# Patient Record
Sex: Female | Born: 1995 | Race: Black or African American | Hispanic: No | Marital: Single | Smoking: Current some day smoker
Health system: Southern US, Community
[De-identification: ages and names within clinical notes are randomized; demographics above are authoritative.]

## PROBLEM LIST (undated history)

## (undated) DIAGNOSIS — N83209 Unspecified ovarian cyst, unspecified side: Secondary | ICD-10-CM

## (undated) DIAGNOSIS — N809 Endometriosis, unspecified: Secondary | ICD-10-CM

## (undated) HISTORY — PX: THERAPEUTIC ABORTION: SHX798

---

## 2019-09-29 ENCOUNTER — Ambulatory Visit: Payer: Self-pay | Attending: Family

## 2019-09-29 DIAGNOSIS — Z23 Encounter for immunization: Secondary | ICD-10-CM

## 2019-09-29 NOTE — Progress Notes (Signed)
   Covid-19 Vaccination Clinic  Name:  Anisa Leanos    MRN: [id number] DOB: 06-30-1996  09/29/2019  Ms. Candella was observed post Covid-19 immunization for 15 minutes without incident. She was provided with Vaccine Information Sheet and instruction to access the V-Safe system.   Ms. Walrond was instructed to call 911 with any severe reactions post vaccine: Marland Kitchen Difficulty breathing  . Swelling of face and throat  . A fast heartbeat  . A bad rash all over body  . Dizziness and weakness   Immunizations Administered    Name Date Dose VIS Date Route   Moderna COVID-19 Vaccine 09/29/2019  1:12 PM 0.5 mL 06/07/2019 Intramuscular   Manufacturer: Moderna   Lot: [id number]   NDC: 31121-624-46

## 2019-11-01 ENCOUNTER — Ambulatory Visit: Payer: Self-pay | Attending: Family

## 2019-11-08 ENCOUNTER — Ambulatory Visit: Payer: Self-pay

## 2019-11-10 ENCOUNTER — Ambulatory Visit: Payer: BLUE CROSS/BLUE SHIELD | Attending: Family

## 2019-11-10 DIAGNOSIS — Z23 Encounter for immunization: Secondary | ICD-10-CM

## 2019-11-10 NOTE — Progress Notes (Signed)
   Covid-19 Vaccination Clinic  Name:  Caroline Lynch    MRN: [id number] DOB: 1995-12-21  11/10/2019  [name] was observed post Covid-19 immunization for 15 minutes without incident. She was provided with Vaccine Information Sheet and instruction to access the V-Safe system.   [name] was instructed to call 911 with any severe reactions post vaccine: Marland Kitchen Difficulty breathing  . Swelling of face and throat  . A fast heartbeat  . A bad rash all over body  . Dizziness and weakness   Immunizations Administered    Name Date Dose VIS Date Route   Moderna COVID-19 Vaccine 11/10/2019  1:19 PM 0.5 mL 06/2019 Intramuscular   Manufacturer: Moderna   Lot: [id number]   NDC: 17616-073-71

## 2019-11-13 ENCOUNTER — Other Ambulatory Visit: Payer: Self-pay

## 2019-11-13 ENCOUNTER — Emergency Department (HOSPITAL_COMMUNITY): Payer: BC Managed Care – PPO

## 2019-11-13 ENCOUNTER — Encounter (HOSPITAL_COMMUNITY): Payer: Self-pay | Admitting: Emergency Medicine

## 2019-11-13 ENCOUNTER — Emergency Department (HOSPITAL_COMMUNITY)
Admission: EM | Admit: 2019-11-13 | Discharge: 2019-11-13 | Disposition: A | Discharge status: Home / Self Care | Payer: BC Managed Care – PPO | Attending: Emergency Medicine | Admitting: Emergency Medicine

## 2019-11-13 DIAGNOSIS — F419 Anxiety disorder, unspecified: Secondary | ICD-10-CM | POA: Diagnosis not present

## 2019-11-13 DIAGNOSIS — R079 Chest pain, unspecified: Secondary | ICD-10-CM | POA: Diagnosis not present

## 2019-11-13 DIAGNOSIS — R000 Tachycardia, unspecified: Secondary | ICD-10-CM | POA: Insufficient documentation

## 2019-11-13 DIAGNOSIS — M79602 Pain in left arm: Secondary | ICD-10-CM | POA: Diagnosis present

## 2019-11-13 DIAGNOSIS — R002 Palpitations: Secondary | ICD-10-CM | POA: Diagnosis not present

## 2019-11-13 LAB — RAPID URINE DRUG SCREEN, HOSP PERFORMED
Amphetamines: NOT DETECTED
Barbiturates: NOT DETECTED
Benzodiazepines: NOT DETECTED
Cocaine: NOT DETECTED
Opiates: NOT DETECTED
Tetrahydrocannabinol: POSITIVE — AB

## 2019-11-13 LAB — CBC WITH DIFFERENTIAL/PLATELET
Abs Immature Granulocytes: 0.01 10*3/uL (ref 0.00–0.07)
Basophils Absolute: 0 10*3/uL (ref 0.0–0.1)
Basophils Relative: 0 %
Eosinophils Absolute: 0.1 10*3/uL (ref 0.0–0.5)
Eosinophils Relative: 2 %
HCT: 37.2 % (ref 36.0–46.0)
Hemoglobin: 11.9 g/dL — ABNORMAL LOW (ref 12.0–15.0)
Immature Granulocytes: 0 %
Lymphocytes Relative: 59 %
Lymphs Abs: 3.8 10*3/uL (ref 0.7–4.0)
MCH: 30.6 pg (ref 26.0–34.0)
MCHC: 32 g/dL (ref 30.0–36.0)
MCV: 95.6 fL (ref 80.0–100.0)
Monocytes Absolute: 0.6 10*3/uL (ref 0.1–1.0)
Monocytes Relative: 10 %
Neutro Abs: 1.9 10*3/uL (ref 1.7–7.7)
Neutrophils Relative %: 29 %
Platelets: 277 10*3/uL (ref 150–400)
RBC: 3.89 MIL/uL (ref 3.87–5.11)
RDW: 12.3 % (ref 11.5–15.5)
WBC: 6.5 10*3/uL (ref 4.0–10.5)
nRBC: 0 % (ref 0.0–0.2)

## 2019-11-13 LAB — BASIC METABOLIC PANEL
Anion gap: 13 (ref 5–15)
BUN: 15 mg/dL (ref 6–20)
CO2: 23 mmol/L (ref 22–32)
Calcium: 9.5 mg/dL (ref 8.9–10.3)
Chloride: 101 mmol/L (ref 98–111)
Creatinine, Ser: 0.91 mg/dL (ref 0.44–1.00)
GFR calc Af Amer: >60 mL/min (ref >60)
GFR calc non Af Amer: >60 mL/min (ref >60)
Glucose, Bld: 151 mg/dL — ABNORMAL HIGH (ref 70–99)
Potassium: 3.1 mmol/L — ABNORMAL LOW (ref 3.5–5.1)
Sodium: 137 mmol/L (ref 135–145)

## 2019-11-13 LAB — TROPONIN I (HIGH SENSITIVITY)
Troponin I (High Sensitivity): <2 ng/L (ref <18)
Troponin I (High Sensitivity): <2 ng/L (ref <18)

## 2019-11-13 LAB — D-DIMER, QUANTITATIVE: D-Dimer, Quant: <0.27 ug/mL-FEU (ref 0.00–0.50)

## 2019-11-13 LAB — ETHANOL: Alcohol, Ethyl (B): 24 mg/dL — ABNORMAL HIGH (ref <10)

## 2019-11-13 LAB — HCG, QUANTITATIVE, PREGNANCY: hCG, Beta Chain, Quant, S: 1 m[IU]/mL (ref <5)

## 2019-11-13 MED ORDER — LORAZEPAM 2 MG/ML IJ SOLN
0.5000 mg | Freq: Once | INTRAMUSCULAR | Status: AC
  Administered 2019-11-13: 06:00:00 0.5 mg via INTRAVENOUS
  Filled 2019-11-13: qty 1

## 2019-11-13 MED ORDER — SODIUM CHLORIDE 0.9 % IV BOLUS
1000.0000 mL | Freq: Once | INTRAVENOUS | Status: AC
  Administered 2019-11-13: 1000 mL via INTRAVENOUS

## 2019-11-13 NOTE — Discharge Instructions (Addendum)
Please return to the emergency department with any new or worsening symptoms.

## 2019-11-13 NOTE — ED Provider Notes (Signed)
[facility] DEPT Provider Note   CSN: [id number] Arrival date & time: 11/13/19  0350     History Chief Complaint  Patient presents with  . Arm Pain  . Tachycardia    Caroline Lynch is a 24 y.o. female.  Patient with a history of anxiety presents with intermittent chest discomfort through today described as involving the left arm, left axilla and left chest without SOB or difficulty breathing. The discomfort can last for varying amounts of time and does not have any identifiable modifying factors. She finished her day at work and went home where she had dinner, some alcohol and marijuana and the symptoms returned shortly afterward, associated with the feeling of her heart racing. The discomfort has improved but she reports continued palpitations. She has a history of anxiety and thought it might be due to that but palpitations have been persistent for over 2 hours. No syncope/near syncope. She reports having received her 2nd COVID vaccine 3 days ago. No nausea, vomiting, cough, congestion, sore throat.   The history is provided by the patient. No language interpreter was used.  Arm Pain Associated symptoms include chest pain. Pertinent negatives include no abdominal pain, no headaches and no shortness of breath.       History reviewed. No pertinent past medical history.  There are no problems to display for this patient.   History reviewed. No pertinent surgical history.   OB History   No obstetric history on file.     No family history on file.  Social History   Tobacco Use  . Smoking status: Never Smoker  . Smokeless tobacco: Never Used  Substance Use Topics  . Alcohol use: Yes    Comment: occasional  . Drug use: Yes    Types: Marijuana    Comment: occasional    Home Medications Prior to Admission medications   Not on File    Allergies    Patient has no known allergies.  Review of Systems   Review of Systems  Constitutional:  Negative for chills and fever.  HENT: Negative.   Respiratory: Negative.  Negative for shortness of breath.   Cardiovascular: Positive for chest pain and palpitations.  Gastrointestinal: Negative.  Negative for abdominal pain and nausea.  Musculoskeletal: Negative.  Negative for myalgias.  Skin: Negative.   Neurological: Negative.  Negative for syncope, weakness and headaches.    Physical Exam Updated Vital Signs BP (!) 159/96 (BP Location: Right Arm)   Pulse (!) 146   Temp 98 F (36.7 C) (Oral)   Resp 18   Ht 5\' 7"  (1.702 m)   Wt 53.5 kg   LMP 11/06/2019   SpO2 100%   BMI 18.48 kg/m   Physical Exam Vitals and nursing note reviewed.  Constitutional:      Appearance: She is well-developed.  HENT:     Head: Normocephalic.  Cardiovascular:     Rate and Rhythm: Regular rhythm. Tachycardia present.     Heart sounds: No murmur.  Pulmonary:     Effort: Pulmonary effort is normal.     Breath sounds: Normal breath sounds. No wheezing, rhonchi or rales.  Chest:     Chest wall: No tenderness.  Abdominal:     General: Bowel sounds are normal.     Palpations: Abdomen is soft.     Tenderness: There is no abdominal tenderness. There is no guarding or rebound.  Musculoskeletal:        General: Normal range of motion.  Cervical back: Normal range of motion and neck supple.  Skin:    General: Skin is warm and dry.     Findings: No rash.  Neurological:     Mental Status: She is alert and oriented to person, place, and time.     ED Results / Procedures / Treatments   Labs (all labs ordered are listed, but only abnormal results are displayed) Labs Reviewed  BASIC METABOLIC PANEL  CBC WITH DIFFERENTIAL/PLATELET  RAPID URINE DRUG SCREEN, HOSP PERFORMED  ETHANOL  D-DIMER, QUANTITATIVE (NOT AT [facility])  HCG, QUANTITATIVE, PREGNANCY  TROPONIN I (HIGH SENSITIVITY)    EKG None  Radiology No results found.  Procedures Procedures (including critical care  time)  Medications Ordered in ED Medications - No data to display  ED Course  I have reviewed the triage vital signs and the nursing notes.  Pertinent labs & imaging results that were available during my care of the patient were reviewed by me and considered in my medical decision making (see chart for details).    MDM Rules/Calculators/A&P                      Patient to ED with ss/sxs as detailed in the HPI.   DDx: PE vs anxiety vs COVID vaccine side effect vs cardiac abnormality vs infection.   She is low risk for PE. D-dimer negative. No evidence of cardiac disease in 24 yo, sinus tach on EKG without ischemia. Troponin <2.  No fever, cough, SoB - doubt infection History of anxiety, however, symptoms lasting longer than would be expected. She was given Ativan and symptoms resolved. There could still be a component of anxiety vs side effect of getting her 2nd COVID vaccine 2 days ago.   She is symptomatically better with HR of 89 on the monitor. She is felt appropriate for discharge home. Discussed return precautions.  Final Clinical Impression(s) / ED Diagnoses Final diagnoses:  None   1. Sinus tachycardia  Rx / DC Orders ED Discharge Orders    None       Elpidio Anis, PA-C 11/13/19 [id number]    Molpus, Jonny Ruiz, MD 11/13/19 [phone]

## 2019-11-13 NOTE — ED Notes (Signed)
Pt. Taken to X-ray.

## 2019-11-13 NOTE — ED Triage Notes (Addendum)
Patient states she has not been feeling well all day. Patient states that she drank ETOH tonight and smoked marijuana and is now having left arm and axilla pain and palpitations. Patient noted to be tachycardic at 170 when hooked up to the monitor. Denies N/V. Hx Anxiety

## 2019-11-29 ENCOUNTER — Ambulatory Visit: Payer: Self-pay

## 2020-10-14 ENCOUNTER — Encounter (HOSPITAL_COMMUNITY): Payer: Self-pay | Admitting: Emergency Medicine

## 2020-10-14 ENCOUNTER — Other Ambulatory Visit: Payer: Self-pay

## 2020-10-14 ENCOUNTER — Ambulatory Visit (HOSPITAL_COMMUNITY)
Admission: EM | Admit: 2020-10-14 | Discharge: 2020-10-14 | Disposition: A | Discharge status: Home / Self Care | Payer: BC Managed Care – PPO | Attending: Student | Admitting: Student

## 2020-10-14 DIAGNOSIS — L03011 Cellulitis of right finger: Secondary | ICD-10-CM

## 2020-10-14 MED ORDER — DOXYCYCLINE HYCLATE 100 MG PO CAPS
100.0000 mg | ORAL_CAPSULE | Freq: Two times a day (BID) | ORAL | 0 refills | Status: AC
Start: 2020-10-14 — End: 2020-10-21

## 2020-10-14 NOTE — ED Provider Notes (Signed)
[facility]    CSN: [id number] Arrival date & time: 10/14/20  1644      History   Chief Complaint Chief Complaint  Patient presents with  . Hand Pain    Right pointer finger    HPI Caroline Lynch is a 25 y.o. female presenting with R hand issue.  Notes 3 days of right index finger swelling and pain.  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  Denies other trauma.  Denies sensation changes.  Denies discharge from the area.  Denies fever/chills.  HPI  History reviewed. No pertinent past medical history.  There are no problems to display for this patient.   History reviewed. No pertinent surgical history.  OB History   No obstetric history on file.      Home Medications    Prior to Admission medications   Medication Sig Start Date End Date Taking? Authorizing Provider  doxycycline (VIBRAMYCIN) 100 MG capsule Take 1 capsule (100 mg total) by mouth 2 (two) times daily for 7 days. 10/14/20 10/21/20 Yes Rhys Martini, PA-C    Family History History reviewed. No pertinent family history.  Social History Social History   Tobacco Use  . Smoking status: Never Smoker  . Smokeless tobacco: Never Used  Substance Use Topics  . Alcohol use: Yes    Comment: occasional  . Drug use: Yes    Types: Marijuana    Comment: occasional     Allergies   Patient has no known allergies.   Review of Systems Review of Systems  Skin: Negative for wound.       Inflammation tip of R index finger  All other systems reviewed and are negative.    Physical Exam Triage Vital Signs ED Triage Vitals  Enc Vitals Group     BP      Pulse      Resp      Temp      Temp src      SpO2      Weight      Height      Head Circumference      Peak Flow      Pain Score      Pain Loc      Pain Edu?      Excl. in GC?    No data found.  Updated Vital Signs BP 129/71 (BP Location: Right Arm)   Pulse  90   Temp 98.3 F (36.8 C) (Oral)   Resp 17   LMP 09/28/2020   SpO2 100%   Visual Acuity Right Eye Distance:   Left Eye Distance:   Bilateral Distance:    Right Eye Near:   Left Eye Near:    Bilateral Near:     Physical Exam Vitals reviewed.  Constitutional:      General: She is not in acute distress.    Appearance: Normal appearance. She is not ill-appearing or diaphoretic.  HENT:     Head: Normocephalic and atraumatic.  Cardiovascular:     Rate and Rhythm: Normal rate and regular rhythm.     Heart sounds: Normal heart sounds.  Pulmonary:     Effort: Pulmonary effort is normal.     Breath sounds: Normal breath sounds.  Skin:    General: Skin is warm.     Comments: Right index finger with erythema and mild swelling surrounding nail bed. No fluctuance or pus. Immature paronychia.  Cap refill <2 seconds, radial pulse 2+.  Neurological:     General: No focal deficit present.     Mental Status: She is alert and oriented to person, place, and time.  Psychiatric:        Mood and Affect: Mood normal.        Behavior: Behavior normal.        Thought Content: Thought content normal.        Judgment: Judgment normal.      UC Treatments / Results  Labs (all labs ordered are listed, but only abnormal results are displayed) Labs Reviewed - No data to display  EKG   Radiology No results found.  Procedures Procedures (including critical care time)  Medications Ordered in UC Medications - No data to display  Initial Impression / Assessment and Plan / UC Course  I have reviewed the triage vital signs and the nursing notes.  Pertinent labs & imaging results that were available during my care of the patient were reviewed by me and considered in my medical decision making (see chart for details).    This patient is a 25 year old female presenting with paronychia of right index finger.  Afebrile, nontachycardic.  This is not a felon.  Doxycycline sent.  She is not  pregnant or breast-feeding.  ED return precautions discussed.  Final Clinical Impressions(s) / UC Diagnoses   Final diagnoses:  Paronychia of right index finger     Discharge Instructions     -Start the antibiotic, doxycycline twice daily for 7 days.  Make sure to wear sunscreen while on this medication if you are going to be outside for long periods of time. -Come back and see [name] if your symptoms get worse despite treatment, like worsening of the swelling, pain, new fever/chills, etc.    ED Prescriptions    Medication Sig Dispense Auth. Provider   doxycycline (VIBRAMYCIN) 100 MG capsule Take 1 capsule (100 mg total) by mouth 2 (two) times daily for 7 days. 14 capsule Rhys Martini, PA-C     PDMP not reviewed this encounter.   Rhys Martini, PA-C 10/14/20 1756

## 2020-10-14 NOTE — ED Triage Notes (Signed)
Pt presents with right pointer finger pain and swelling xs 3-4 days.

## 2020-10-14 NOTE — Discharge Instructions (Addendum)
-  Start the antibiotic, doxycycline twice daily for 7 days.  Make sure to wear sunscreen while on this medication if you are going to be outside for long periods of time. -Come back and see [name] if your symptoms get worse despite treatment, like worsening of the swelling, pain, new fever/chills, etc.

## 2021-06-27 ENCOUNTER — Encounter: Payer: BC Managed Care – PPO | Admitting: Obstetrics and Gynecology

## 2021-11-06 ENCOUNTER — Encounter (HOSPITAL_COMMUNITY): Payer: Self-pay | Admitting: Emergency Medicine

## 2021-11-06 ENCOUNTER — Other Ambulatory Visit: Payer: Self-pay

## 2021-11-06 ENCOUNTER — Ambulatory Visit (HOSPITAL_COMMUNITY)
Admission: EM | Admit: 2021-11-06 | Discharge: 2021-11-07 | Disposition: A | Discharge status: Home / Self Care | Payer: BC Managed Care – PPO | Attending: Student | Admitting: Student

## 2021-11-06 DIAGNOSIS — N83519 Torsion of ovary and ovarian pedicle, unspecified side: Secondary | ICD-10-CM

## 2021-11-06 DIAGNOSIS — N83201 Unspecified ovarian cyst, right side: Secondary | ICD-10-CM | POA: Diagnosis not present

## 2021-11-06 DIAGNOSIS — R102 Pelvic and perineal pain: Secondary | ICD-10-CM | POA: Diagnosis not present

## 2021-11-06 DIAGNOSIS — N83202 Unspecified ovarian cyst, left side: Secondary | ICD-10-CM | POA: Diagnosis not present

## 2021-11-06 DIAGNOSIS — N83209 Unspecified ovarian cyst, unspecified side: Secondary | ICD-10-CM

## 2021-11-06 LAB — CBC
HCT: 36.2 % (ref 36.0–46.0)
Hemoglobin: 11.6 g/dL — ABNORMAL LOW (ref 12.0–15.0)
MCH: 29.1 pg (ref 26.0–34.0)
MCHC: 32 g/dL (ref 30.0–36.0)
MCV: 90.7 fL (ref 80.0–100.0)
Platelets: 261 10*3/uL (ref 150–400)
RBC: 3.99 MIL/uL (ref 3.87–5.11)
RDW: 15.1 % (ref 11.5–15.5)
WBC: 6.7 10*3/uL (ref 4.0–10.5)
nRBC: 0 % (ref 0.0–0.2)

## 2021-11-06 LAB — COMPREHENSIVE METABOLIC PANEL
ALT: 8 U/L (ref 0–44)
AST: 18 U/L (ref 15–41)
Albumin: 3.8 g/dL (ref 3.5–5.0)
Alkaline Phosphatase: 41 U/L (ref 38–126)
Anion gap: 7 (ref 5–15)
BUN: 13 mg/dL (ref 6–20)
CO2: 22 mmol/L (ref 22–32)
Calcium: 9.2 mg/dL (ref 8.9–10.3)
Chloride: 107 mmol/L (ref 98–111)
Creatinine, Ser: 0.8 mg/dL (ref 0.44–1.00)
GFR, Estimated: >60 mL/min (ref >60)
Glucose, Bld: 94 mg/dL (ref 70–99)
Potassium: 4.4 mmol/L (ref 3.5–5.1)
Sodium: 136 mmol/L (ref 135–145)
Total Bilirubin: 1 mg/dL (ref 0.3–1.2)
Total Protein: 7.2 g/dL (ref 6.5–8.1)

## 2021-11-06 LAB — URINALYSIS, ROUTINE W REFLEX MICROSCOPIC
Bacteria, UA: NONE SEEN
Bilirubin Urine: NEGATIVE
Glucose, UA: NEGATIVE mg/dL
Ketones, ur: 20 mg/dL — AB
Nitrite: NEGATIVE
Protein, ur: NEGATIVE mg/dL
RBC / HPF: >50 RBC/hpf — ABNORMAL HIGH (ref 0–5)
Specific Gravity, Urine: 1.024 (ref 1.005–1.030)
pH: 5 (ref 5.0–8.0)

## 2021-11-06 LAB — LIPASE, BLOOD: Lipase: 26 U/L (ref 11–51)

## 2021-11-06 LAB — I-STAT BETA HCG BLOOD, ED (MC, WL, AP ONLY): I-stat hCG, quantitative: <5 m[IU]/mL (ref <5)

## 2021-11-06 NOTE — ED Triage Notes (Signed)
Pt c/o generalized abd pain x 12 hours, denies urinary symptoms, n/v/d/fever; LBM today  ?

## 2021-11-06 NOTE — ED Provider Triage Note (Signed)
Emergency Medicine Provider Triage Evaluation Note ? ?Caroline Lynch , a 26 y.o. female  was evaluated in triage.  Pt complains of generalized abdominal pain, slightly worse in lower quadrants.  Patient states that her pain began at 530 this morning.  At 1 PM she tried to eat and states that eating made her pain worse.  Patient does endorse nausea but denies vomiting.  Endorses a bowel movement this morning but no diarrhea.  Denies chest pain patient states that she is currently on her period and that there is no chance that she is pregnant.  Patient is sexually active.  Denies vaginal discharge, dysuria ? ?Review of Systems  ?Positive: Abdominal pain, nausea ?Negative: Dysuria, vaginal discharge, diarrhea ? ?Physical Exam  ?BP (!) 131/94 (BP Location: Right Arm)   Pulse (!) 104   Temp 99.6 ?F (37.6 ?C) (Oral)   Resp 18   Ht 5\' 7"  (1.702 m)   Wt 56.7 kg   LMP 11/03/2021 (Exact Date)   SpO2 100%   BMI 19.58 kg/m?  ?Gen:   Awake, no distress   ?Resp:  Normal effort  ?MSK:   Moves extremities without difficulty  ?Other:   ? ?Medical Decision Making  ?Medically screening exam initiated at 9:04 PM.  Appropriate orders placed.  Maesyn Frisinger was informed that the remainder of the evaluation will be completed by another provider, this initial triage assessment does not replace that evaluation, and the importance of remaining in the ED until their evaluation is complete. ? ? ?  ?Caroline Lanes, PA-C ?11/06/21 2111 ? ?

## 2021-11-07 ENCOUNTER — Encounter (HOSPITAL_COMMUNITY)
Admission: EM | Disposition: A | Discharge status: Home / Self Care | Payer: Self-pay | Source: Home / Self Care | Attending: Student

## 2021-11-07 ENCOUNTER — Emergency Department (HOSPITAL_COMMUNITY): Payer: BC Managed Care – PPO

## 2021-11-07 ENCOUNTER — Other Ambulatory Visit: Payer: Self-pay

## 2021-11-07 ENCOUNTER — Encounter (HOSPITAL_COMMUNITY): Payer: Self-pay | Admitting: Certified Registered Nurse Anesthetist

## 2021-11-07 ENCOUNTER — Emergency Department (HOSPITAL_COMMUNITY): Payer: BC Managed Care – PPO | Admitting: Anesthesiology

## 2021-11-07 DIAGNOSIS — N83202 Unspecified ovarian cyst, left side: Secondary | ICD-10-CM

## 2021-11-07 DIAGNOSIS — N83201 Unspecified ovarian cyst, right side: Secondary | ICD-10-CM

## 2021-11-07 DIAGNOSIS — N83209 Unspecified ovarian cyst, unspecified side: Secondary | ICD-10-CM | POA: Diagnosis present

## 2021-11-07 HISTORY — PX: DIAGNOSTIC LARYNGOSCOPY: SHX5368

## 2021-11-07 SURGERY — LARYNGOSCOPY, DIAGNOSTIC
Anesthesia: General

## 2021-11-07 MED ORDER — ACETAMINOPHEN 10 MG/ML IV SOLN
INTRAVENOUS | Status: AC
  Filled 2021-11-07: qty 100

## 2021-11-07 MED ORDER — CHLORHEXIDINE GLUCONATE 0.12 % MT SOLN
OROMUCOSAL | Status: AC
  Administered 2021-11-07: 15 mL via OROMUCOSAL
  Filled 2021-11-07: qty 15

## 2021-11-07 MED ORDER — PROPOFOL 10 MG/ML IV BOLUS
INTRAVENOUS | Status: DC | PRN
  Administered 2021-11-07: 160 mg via INTRAVENOUS
  Administered 2021-11-07: 20 mg via INTRAVENOUS

## 2021-11-07 MED ORDER — ALUM & MAG HYDROXIDE-SIMETH 200-200-20 MG/5ML PO SUSP
30.0000 mL | Freq: Once | ORAL | Status: AC
  Administered 2021-11-07: 30 mL via ORAL
  Filled 2021-11-07: qty 30

## 2021-11-07 MED ORDER — FENTANYL CITRATE (PF) 250 MCG/5ML IJ SOLN
INTRAMUSCULAR | Status: AC
  Filled 2021-11-07: qty 5

## 2021-11-07 MED ORDER — MORPHINE SULFATE (PF) 4 MG/ML IV SOLN
4.0000 mg | Freq: Once | INTRAVENOUS | Status: AC
  Administered 2021-11-07: 4 mg via INTRAVENOUS
  Filled 2021-11-07: qty 1

## 2021-11-07 MED ORDER — SUCCINYLCHOLINE CHLORIDE 200 MG/10ML IV SOSY
PREFILLED_SYRINGE | INTRAVENOUS | Status: DC | PRN
  Administered 2021-11-07: 100 mg via INTRAVENOUS

## 2021-11-07 MED ORDER — LIDOCAINE 2% (20 MG/ML) 5 ML SYRINGE
INTRAMUSCULAR | Status: AC
  Filled 2021-11-07: qty 5

## 2021-11-07 MED ORDER — BUPIVACAINE-EPINEPHRINE 0.25% -1:200000 IJ SOLN
INTRAMUSCULAR | Status: DC | PRN
  Administered 2021-11-07: 30 mL

## 2021-11-07 MED ORDER — OXYCODONE HCL 5 MG PO TABS
5.0000 mg | ORAL_TABLET | Freq: Once | ORAL | Status: AC
  Administered 2021-11-07: 5 mg via ORAL

## 2021-11-07 MED ORDER — KETOROLAC TROMETHAMINE 30 MG/ML IJ SOLN
INTRAMUSCULAR | Status: DC | PRN
  Administered 2021-11-07: 30 mg via INTRAVENOUS

## 2021-11-07 MED ORDER — LACTATED RINGERS IV SOLN: INTRAVENOUS | Status: DC

## 2021-11-07 MED ORDER — ONDANSETRON HCL 4 MG/2ML IJ SOLN
INTRAMUSCULAR | Status: AC
  Filled 2021-11-07: qty 2

## 2021-11-07 MED ORDER — SUCCINYLCHOLINE CHLORIDE 200 MG/10ML IV SOSY
PREFILLED_SYRINGE | INTRAVENOUS | Status: AC
  Filled 2021-11-07: qty 10

## 2021-11-07 MED ORDER — ROCURONIUM BROMIDE 10 MG/ML (PF) SYRINGE
PREFILLED_SYRINGE | INTRAVENOUS | Status: DC | PRN
  Administered 2021-11-07: 50 mg via INTRAVENOUS
  Administered 2021-11-07: 20 mg via INTRAVENOUS

## 2021-11-07 MED ORDER — ACETAMINOPHEN 10 MG/ML IV SOLN
INTRAVENOUS | Status: DC | PRN
  Administered 2021-11-07: 1000 mg via INTRAVENOUS

## 2021-11-07 MED ORDER — PROPOFOL 10 MG/ML IV BOLUS
INTRAVENOUS | Status: AC
  Filled 2021-11-07: qty 20

## 2021-11-07 MED ORDER — KETOROLAC TROMETHAMINE 30 MG/ML IJ SOLN
INTRAMUSCULAR | Status: AC
  Filled 2021-11-07: qty 1

## 2021-11-07 MED ORDER — ORAL CARE MOUTH RINSE: 15.0000 mL | Freq: Once | OROMUCOSAL | Status: AC

## 2021-11-07 MED ORDER — OXYCODONE HCL 5 MG PO TABS
ORAL_TABLET | ORAL | Status: AC
  Filled 2021-11-07: qty 1

## 2021-11-07 MED ORDER — DEXAMETHASONE SODIUM PHOSPHATE 10 MG/ML IJ SOLN
INTRAMUSCULAR | Status: AC
  Filled 2021-11-07: qty 1

## 2021-11-07 MED ORDER — FENTANYL CITRATE (PF) 100 MCG/2ML IJ SOLN: 25.0000 ug | INTRAMUSCULAR | Status: DC | PRN

## 2021-11-07 MED ORDER — 0.9 % SODIUM CHLORIDE (POUR BTL) OPTIME
TOPICAL | Status: DC | PRN
  Administered 2021-11-07: 1000 mL

## 2021-11-07 MED ORDER — ROCURONIUM BROMIDE 10 MG/ML (PF) SYRINGE
PREFILLED_SYRINGE | INTRAVENOUS | Status: AC
  Filled 2021-11-07: qty 10

## 2021-11-07 MED ORDER — ONDANSETRON HCL 4 MG/2ML IJ SOLN
INTRAMUSCULAR | Status: DC | PRN
  Administered 2021-11-07: 4 mg via INTRAVENOUS

## 2021-11-07 MED ORDER — CHLORHEXIDINE GLUCONATE 0.12 % MT SOLN: 15.0000 mL | Freq: Once | OROMUCOSAL | Status: AC

## 2021-11-07 MED ORDER — ONDANSETRON HCL 4 MG/2ML IJ SOLN
4.0000 mg | Freq: Once | INTRAMUSCULAR | Status: AC
  Administered 2021-11-07: 4 mg via INTRAVENOUS
  Filled 2021-11-07: qty 2

## 2021-11-07 MED ORDER — OXYCODONE-ACETAMINOPHEN 5-325 MG PO TABS: 1.0000 | ORAL_TABLET | Freq: Four times a day (QID) | ORAL | 0 refills | Status: DC | PRN

## 2021-11-07 MED ORDER — SODIUM CHLORIDE 0.9 % IR SOLN
Status: DC | PRN
Start: 2021-11-07 — End: 2021-11-07
  Administered 2021-11-07: 1000 mL

## 2021-11-07 MED ORDER — IOHEXOL 300 MG/ML  SOLN
100.0000 mL | Freq: Once | INTRAMUSCULAR | Status: AC | PRN
  Administered 2021-11-07: 100 mL via INTRAVENOUS

## 2021-11-07 MED ORDER — BUPIVACAINE-EPINEPHRINE (PF) 0.25% -1:200000 IJ SOLN
INTRAMUSCULAR | Status: AC
  Filled 2021-11-07: qty 30

## 2021-11-07 MED ORDER — SUGAMMADEX SODIUM 200 MG/2ML IV SOLN
INTRAVENOUS | Status: DC | PRN
  Administered 2021-11-07: 200 mg via INTRAVENOUS

## 2021-11-07 MED ORDER — LIDOCAINE VISCOUS HCL 2 % MT SOLN
15.0000 mL | Freq: Once | OROMUCOSAL | Status: AC
  Administered 2021-11-07: 15 mL via ORAL
  Filled 2021-11-07: qty 15

## 2021-11-07 MED ORDER — MIDAZOLAM HCL 2 MG/2ML IJ SOLN
INTRAMUSCULAR | Status: AC
  Filled 2021-11-07: qty 2

## 2021-11-07 MED ORDER — LIDOCAINE 2% (20 MG/ML) 5 ML SYRINGE
INTRAMUSCULAR | Status: DC | PRN
  Administered 2021-11-07: 60 mg via INTRAVENOUS

## 2021-11-07 MED ORDER — FENTANYL CITRATE (PF) 250 MCG/5ML IJ SOLN
INTRAMUSCULAR | Status: DC | PRN
  Administered 2021-11-07 (×3): 50 ug via INTRAVENOUS

## 2021-11-07 MED ORDER — ACETAMINOPHEN 325 MG PO TABS
650.0000 mg | ORAL_TABLET | Freq: Four times a day (QID) | ORAL | Status: DC | PRN
  Administered 2021-11-07: 650 mg via ORAL
  Filled 2021-11-07: qty 2

## 2021-11-07 MED ORDER — DICYCLOMINE HCL 10 MG PO CAPS
10.0000 mg | ORAL_CAPSULE | Freq: Once | ORAL | Status: AC
  Administered 2021-11-07: 10 mg via ORAL
  Filled 2021-11-07: qty 1

## 2021-11-07 MED ORDER — DEXAMETHASONE SODIUM PHOSPHATE 10 MG/ML IJ SOLN
INTRAMUSCULAR | Status: DC | PRN
  Administered 2021-11-07: 10 mg via INTRAVENOUS

## 2021-11-07 MED ORDER — MIDAZOLAM HCL 2 MG/2ML IJ SOLN
INTRAMUSCULAR | Status: DC | PRN
  Administered 2021-11-07: 2 mg via INTRAVENOUS

## 2021-11-07 SURGICAL SUPPLY — 29 items
CATH ROBINSON RED A/P 16FR (CATHETERS) ×3 IMPLANT
DERMABOND ADVANCED (GAUZE/BANDAGES/DRESSINGS) ×1
DERMABOND ADVANCED .7 DNX12 (GAUZE/BANDAGES/DRESSINGS) ×2 IMPLANT
DURAPREP 26ML APPLICATOR (WOUND CARE) ×3 IMPLANT
GAUZE 4X4 16PLY ~~LOC~~+RFID DBL (SPONGE) ×2 IMPLANT
GLOVE BIO SURGEON STRL SZ 6.5 (GLOVE) ×3 IMPLANT
GLOVE BIOGEL PI IND STRL 7.0 (GLOVE) ×6 IMPLANT
GLOVE BIOGEL PI INDICATOR 7.0 (GLOVE) ×3
GLOVE ECLIPSE 7.0 STRL STRAW (GLOVE) ×3 IMPLANT
GOWN STRL REUS W/ TWL LRG LVL3 (GOWN DISPOSABLE) ×4 IMPLANT
GOWN STRL REUS W/TWL LRG LVL3 (GOWN DISPOSABLE) ×2
IRRIG SUCT STRYKERFLOW 2 WTIP (MISCELLANEOUS) ×3
IRRIGATION SUCT STRKRFLW 2 WTP (MISCELLANEOUS) ×1 IMPLANT
KIT TURNOVER KIT B (KITS) ×3 IMPLANT
NS IRRIG 1000ML POUR BTL (IV SOLUTION) ×3 IMPLANT
PACK LAPAROSCOPY BASIN (CUSTOM PROCEDURE TRAY) ×3 IMPLANT
PACK TRENDGUARD 450 HYBRID PRO (MISCELLANEOUS) IMPLANT
POUCH SPECIMEN RETRIEVAL 10MM (ENDOMECHANICALS) IMPLANT
PROTECTOR NERVE ULNAR (MISCELLANEOUS) ×6 IMPLANT
SET TUBE SMOKE EVAC HIGH FLOW (TUBING) ×3 IMPLANT
SHEARS HARMONIC ACE PLUS 36CM (ENDOMECHANICALS) ×2 IMPLANT
SLEEVE ENDOPATH XCEL 5M (ENDOMECHANICALS) ×5 IMPLANT
SUT MNCRL AB 4-0 PS2 18 (SUTURE) ×3 IMPLANT
SUT VICRYL 0 UR6 27IN ABS (SUTURE) ×2 IMPLANT
TOWEL GREEN STERILE FF (TOWEL DISPOSABLE) ×6 IMPLANT
TRAY FOLEY W/BAG SLVR 14FR (SET/KITS/TRAYS/PACK) IMPLANT
TRENDGUARD 450 HYBRID PRO PACK (MISCELLANEOUS)
TROCAR XCEL BLUNT TIP 100MML (ENDOMECHANICALS) ×2 IMPLANT
TROCAR XCEL NON-BLD 5MMX100MML (ENDOMECHANICALS) ×3 IMPLANT

## 2021-11-07 NOTE — Anesthesia Postprocedure Evaluation (Signed)
Anesthesia Post Note ? ?Patient: Semaj Kham ? ?Procedure(s) Performed: DIAGNOSTIC LARYNGOSCOPY, lysis of adhesions, peritoneal wall biopsy ? ?  ? ?Patient location during evaluation: PACU ?Anesthesia Type: General ?Level of consciousness: awake and alert ?Pain management: pain level controlled ?Vital Signs Assessment: post-procedure vital signs reviewed and stable ?Respiratory status: spontaneous breathing, nonlabored ventilation, respiratory function stable and patient connected to nasal cannula oxygen ?Cardiovascular status: blood pressure returned to baseline and stable ?Postop Assessment: no apparent nausea or vomiting ?Anesthetic complications: no ? ? ?No notable events documented. ? ?Last Vitals:  ?Vitals:  ? 11/07/21 1930 11/07/21 1943  ?BP: 127/87 (!) 131/95  ?Pulse: 83 67  ?Resp: 15 15  ?Temp:  36.8 ?C  ?SpO2: 100% 100%  ?  ?Last Pain:  ?Vitals:  ? 11/07/21 1943  ?TempSrc:   ?PainSc: 1   ? ? ?  ?  ?  ?  ?  ?  ? ?Collene Schlichter ? ? ? ? ?

## 2021-11-07 NOTE — H&P (Signed)
? ? ?Preoperative History and Physical ? ?Caroline Lynch is a 26 y.o. G0 here for surgical evaluation and management of possible ovarian torsion.  She presented to [facility] ER with lower abdominal pain and nausea without emesis for two days. She is currently on her menstrual period. In the ER, she was noted to have a temperature of 100, resolved after administration of acetaminophen. Normal WBC of 6.7 and hemoglobin on 11.6.  Ultrasound showed right ovarian cysts and a left ovarian cyst, pulse doppler evaluation of right ovary was normal.  Pulsed Doppler evaluation of the left ovary demonstrated no arterial or venous waveforms concerning for torsion. Our service was consulted for further evaluation. ? ?Proposed surgery: Laparoscopy, possible ovarian cystectomy, possible oophorectomy ? ?History reviewed. No pertinent past medical history. ? ?History reviewed. No pertinent surgical history. ? ?OB History  ?No obstetric history on file.  ?Patient denies any other pertinent gynecologic issues.  ? ?No current facility-administered medications on file prior to encounter.  ? ?Current Outpatient Medications on File Prior to Encounter  ?Medication Sig Dispense Refill  ? IBUPROFEN PO Take 3 tablets by mouth as needed (for periods).    ? ?No Known Allergies ? ?Social History:   reports that she has never smoked. She has never used smokeless tobacco. She reports current alcohol use. She reports current drug use. Drug: Marijuana. ? ?History reviewed. No pertinent family history. ? ?Review of Systems: Pertinent items noted in HPI and remainder of comprehensive ROS otherwise negative. ? ?PHYSICAL EXAM: ?Blood pressure 109/65, pulse 78, temperature 98.6 ?F (37 ?C), resp. rate 16, height 5\' 7"  (1.702 m), weight 56.7 kg, last menstrual period 11/03/2021, SpO2 100 %. ?CONSTITUTIONAL: Well-developed, well-nourished female in no acute distress.  ?HENT:  Normocephalic, atraumatic, External right and left ear normal. Oropharynx is clear and  moist ?EYES: Conjunctivae and EOM are normal. Pupils are equal, round, and reactive to light. No scleral icterus.  ?NECK: Normal range of motion, supple, no masses ?SKIN: Skin is warm and dry. No rash noted. Not diaphoretic. No erythema. No pallor. ?NEUROLOGIC: Alert and oriented to person, place, and time. Normal reflexes, muscle tone coordination. No cranial nerve deficit noted. ?PSYCHIATRIC: Normal mood and affect. Normal behavior. Normal judgment and thought content. ?CARDIOVASCULAR: Normal heart rate noted, regular rhythm ?RESPIRATORY: Effort and breath sounds normal, no problems with respiration noted ?ABDOMEN: Soft, moderate tenderness in RLQ and LLQ, no rebound or guarding, nondistended. ?PELVIC: Deferred ?MUSCULOSKELETAL: Normal range of motion. No edema and no tenderness. 2+ distal pulses. ? ?Labs: ?Results for orders placed or performed during the hospital encounter of 11/06/21 (from the past 336 hour(s))  ?Lipase, blood  ? Collection Time: 11/06/21  9:26 PM  ?Result Value Ref Range  ? Lipase 26 11 - 51 U/L  ?Comprehensive metabolic panel  ? Collection Time: 11/06/21  9:26 PM  ?Result Value Ref Range  ? Sodium 136 135 - 145 mmol/L  ? Potassium 4.4 3.5 - 5.1 mmol/L  ? Chloride 107 98 - 111 mmol/L  ? CO2 22 22 - 32 mmol/L  ? Glucose, Bld 94 70 - 99 mg/dL  ? BUN 13 6 - 20 mg/dL  ? Creatinine, Ser 0.80 0.44 - 1.00 mg/dL  ? Calcium 9.2 8.9 - 10.3 mg/dL  ? Total Protein 7.2 6.5 - 8.1 g/dL  ? Albumin 3.8 3.5 - 5.0 g/dL  ? AST 18 15 - 41 U/L  ? ALT 8 0 - 44 U/L  ? Alkaline Phosphatase 41 38 - 126 U/L  ? Total  Bilirubin 1.0 0.3 - 1.2 mg/dL  ? GFR, Estimated >60 >60 mL/min  ? Anion gap 7 5 - 15  ?CBC  ? Collection Time: 11/06/21  9:26 PM  ?Result Value Ref Range  ? WBC 6.7 4.0 - 10.5 K/uL  ? RBC 3.99 3.87 - 5.11 MIL/uL  ? Hemoglobin 11.6 (L) 12.0 - 15.0 g/dL  ? HCT 36.2 36.0 - 46.0 %  ? MCV 90.7 80.0 - 100.0 fL  ? MCH 29.1 26.0 - 34.0 pg  ? MCHC 32.0 30.0 - 36.0 g/dL  ? RDW 15.1 11.5 - 15.5 %  ? Platelets 261 150 -  400 K/uL  ? nRBC 0.0 0.0 - 0.2 %  ?Urinalysis, Routine w reflex microscopic Urine, Clean Catch  ? Collection Time: 11/06/21  9:30 PM  ?Result Value Ref Range  ? Color, Urine YELLOW YELLOW  ? APPearance HAZY (A) CLEAR  ? Specific Gravity, Urine 1.024 1.005 - 1.030  ? pH 5.0 5.0 - 8.0  ? Glucose, UA NEGATIVE NEGATIVE mg/dL  ? Hgb urine dipstick LARGE (A) NEGATIVE  ? Bilirubin Urine NEGATIVE NEGATIVE  ? Ketones, ur 20 (A) NEGATIVE mg/dL  ? Protein, ur NEGATIVE NEGATIVE mg/dL  ? Nitrite NEGATIVE NEGATIVE  ? Leukocytes,Ua SMALL (A) NEGATIVE  ? RBC / HPF >50 (H) 0 - 5 RBC/hpf  ? WBC, UA 6-10 0 - 5 WBC/hpf  ? Bacteria, UA NONE SEEN NONE SEEN  ? Squamous Epithelial / LPF 0-5 0 - 5  ? Mucus PRESENT   ? Non Squamous Epithelial 0-5 (A) NONE SEEN  ?I-Stat beta hCG blood, ED  ? Collection Time: 11/06/21 10:52 PM  ?Result Value Ref Range  ? I-stat hCG, quantitative <5.0 <5 mIU/mL  ? Comment 3          ? ? ?Imaging Studies: ?CT ABDOMEN PELVIS W CONTRAST ? ?Result Date: 11/07/2021 ?CLINICAL DATA:  Right lower quadrant abdominal pain EXAM: CT ABDOMEN AND PELVIS WITH CONTRAST TECHNIQUE: Multidetector CT imaging of the abdomen and pelvis was performed using the standard protocol following bolus administration of intravenous contrast. RADIATION DOSE REDUCTION: This exam was performed according to the departmental dose-optimization program which includes automated exposure control, adjustment of the mA and/or kV according to patient size and/or use of iterative reconstruction technique. CONTRAST:  160mL OMNIPAQUE IOHEXOL 300 MG/ML  SOLN COMPARISON:  None Available. FINDINGS: Lower chest: Unremarkable Hepatobiliary: Unremarkable Pancreas: Unremarkable Spleen: Unremarkable Adrenals/Urinary Tract: Unremarkable Stomach/Bowel: No compelling findings of appendicitis. No dilated bowel noted. There is formed stool in the colon. Vascular/Lymphatic: Unremarkable Reproductive: Serpentine cystic lesions along the adnexa suspicious for hydrosalpinx  with adjacent ovarian cyst less likely. There is some accentuated enhancement along the cystic lesions on the right, cannot exclude pyosalpinx or tubo-ovarian abscess. No substantial thickening of the endometrium. Slight indistinctness of contours of the right ovary, cannot exclude inflammation. Other: Small amount of complex free fluid along the right cul-de-sac, internal density 17 Hounsfield units. Musculoskeletal: Unremarkable IMPRESSION: 1. Complex cystic lesions along both adnexa with a somewhat serpentine appearance raising the possibility of hydrosalpinx, and potentially with inflammatory findings along the right adnexa such that tubo-ovarian abscess, hematosalpinx, or pyosalpinx cannot be excluded. Small amount of complex free pelvic fluid. Correlate with any clinical findings suggestive of pelvic inflammatory disease. Pelvic sonography may be helpful in further workup if clinically indicated. 2. No appendiceal abnormality observed. Electronically Signed   By: Van Clines M.D.   On: 11/07/2021 13:57  ? ?US PELVIC COMPLETE W TRANSVAGINAL AND TORSION R/O ? ?Result  Date: 11/07/2021 ?CLINICAL DATA:  Rule out ovarian torsion EXAM: TRANSABDOMINAL AND TRANSVAGINAL ULTRASOUND OF PELVIS DOPPLER ULTRASOUND OF OVARIES TECHNIQUE: Both transabdominal and transvaginal ultrasound examinations of the pelvis were performed. Transabdominal technique was performed for global imaging of the pelvis including uterus, ovaries, adnexal regions, and pelvic cul-de-sac. It was necessary to proceed with endovaginal exam following the transabdominal exam to visualize the endometrium and ovaries. Color and duplex Doppler ultrasound was utilized to evaluate blood flow to the ovaries. COMPARISON:  CT abdomen 11/07/2021 FINDINGS: Uterus Measurements: 8.2 x 3.8 x 4.7 cm = volume: 77.3 mL. No fibroids or other mass visualized. Endometrium Thickness: 5.6 mm.  No focal abnormality visualized. Right ovary Measurements: 5.4 x 4.4 x 3.9 cm =  volume: 48.8 mL. Two hypoechoic avascular right ovarian cystic masses measuring3.1 and 2.4 cm respectively which may reflect involuting follicles. Left ovary Measurements: 4 x 2.7 x 3 cm = volume: 16.2 mL

## 2021-11-07 NOTE — Op Note (Signed)
Preoperative diagnosis: Pelvic pain, ? Ovarian torsion  ? ?Postoperative diagnosis: Same ? ?Procedure: Diagnostic laparoscopy ? ?Surgeon: Tinnie Gens, MD ? ?Anesthesia: Gwen Pounds, MD ?local ? ?Findings: Normal appearing uterus. Evidence of endometriosis with adhesions of bowel to posterior uterus, adhesions to adnexa, smattering of endometriosis in anterior and posterior cul-de-sacs.  Normal liver edge. Bilateral enlarged ovaries. No evidence of torsion ? ?Estimated blood loss: Minimal ? ?Complications: None known ? ?Specimens: Peritoneal biopsies ? ?Disposition of Specimen:  Pathology ? ?Reason for procedure: 26 y.o. No obstetric history on file. with h/o pelvic pain, worsening over the last few days and on her cycle. ? ?Procedure: Patient was taken to the operating room was placed in dorsal lithotomy in Allen stirrups. She was prepped and draped in the usual sterile fashion. A timeout was performed. The patient had SCDs in place. Foley catheter is used to drain bladder. Speculum was placed inside the vagina. The cervix was visualized and grasped anteriorly with a single-tooth tenaculum. A Hulka tenaculum was placed through the cervix for uterine manipulation. The single tooth tenaculum and speculum were removed from the vagina.  Attention was then turned to the abdomen. Six cc of 0.25% Marcaine was injected at the umbilicus. Two Allis clamps were used to tent up the skin of the umbilicus a vertical one half centimeter incision was made here. The fascia was incised with the knife  And the peritoneum was entered sharply with this incision. Two edges of the fascia were tagged with a 0 Vicryl suture on a UR 6 needle. A Hassan trocar was placed through this incision and a pneumoperitoneum was created. The patient was then placed in Trendelenburg. The pelvis was inspected in a systematic fashion. The findings are as noted above. The upper abdomen was inspected the liver edge gallbladder and stomach appeared  normal. Blunt dissection used to take down adhesion of ovary and tube on left to anterior abdominal wall and to take down filmy adhesions of bowel to ovary. A dense adhesion of bowel and ovary to posterior uterus were left in situ. Chocolate fluid noted from ovarian manipulation. Peritoneal biopsies obtained. There were no additional findings in the pelvis and so the procedure was terminated. Umbilical fascia closure with vicryl and skin closed with Vicryl and Dermabond. All instrument, needle  and lap counts were correct x 2. The patient was awakened to recovery in stable condition. ? ?Shelbie Proctor PrattMD ?11/07/2021 ?7:13 PM ? ?

## 2021-11-07 NOTE — ED Provider Notes (Signed)
Transfer of Care Note ?I assumed care of Caroline Lynch on 11/07/2021 at @NOWNR @. ? ?Briefly, Caroline Lynch is a 26 y.o. female who: ?- Acute RLQ tenderness ?- CT w concern for TOV vs hydrosalpinx ? ?  ?CT ABDOMEN PELVIS W CONTRAST  ?Final Result  ?  ?30 PELVIC COMPLETE W TRANSVAGINAL AND TORSION R/O    (Results Pending)  ? ?The plan includes: ?- f/u w [name], if non acute then okay for D/C  ? ? ?Please refer to the original provider?s note for additional information regarding the care of [name]. ? ?### ?Reassessment: ?I personally reassessed the patient: Patient continued to have lower abdominal pain.  However pain is adequately managed after morphine. ? ?Vitals:  ? 11/07/21 1655 11/07/21 1915  ?BP: (!) 147/100   ?Pulse: 92   ?Resp: 16   ?Temp: 98.6 ?F (37 ?C) (!) 97.2 ?F (36.2 ?C)  ?SpO2: 100%   ? ? ? ?Additional MDM: ?-Patient ovarian ultrasound showed concerning findings for left ovarian torsion.  Per radiology, there is relative hypoechoic area in the left ovary may reflect an area of hemorrhagic ovarian infarct.  There is no right ovarian torsion. ?-Spoke with Dr. 01/07/22 (OB/Gyn) who will take patient for diagnostic laparoscopy.  Please see their note for further detail.  Patient has remained stable prior to going to the OR.  No acute intervention was needed by IR team. ? ?Dispo: TO OR  ?  ?Shawnie Pons, MD ?11/07/21 1939 ? ?  ?01/07/22, MD ?11/07/21 2220 ? ?

## 2021-11-07 NOTE — ED Provider Notes (Signed)
?[facility] ?Provider Note ? ? ?CSN: [id number] ?Arrival date & time: 11/06/21  2027 ? ?  ? ?History ? ?Chief Complaint  ?Patient presents with  ? Abdominal Pain  ? ? ?Caroline Lynch is a 26 y.o. female past medical history presents with sharp abdominal pain over the last 2 days.  She reports that initially started and then went away, then returned, is most focally located in the right lower quadrant.  Patient reports that she is also currently on her menstrual cycle, however her menstrual cycle is normal for her.  She denies any vaginal discharge, dyspareunia, dysuria.  She denies previous history of kidney stones.  She denies concern for STI.  She endorses that she has had some nausea without vomiting, diarrhea.  She developed a fever while in the waiting room which resolved with Tylenol x1. ? ? ?Abdominal Pain ?Associated symptoms: nausea   ? ?  ? ?Home Medications ?Prior to Admission medications   ?Medication Sig Start Date End Date Taking? Authorizing Provider  ?IBUPROFEN PO Take 3 tablets by mouth as needed (for periods).   Yes [provider]  ?   ? ?Allergies    ?Patient has no known allergies.   ? ?Review of Systems   ?Review of Systems  ?Gastrointestinal:  Positive for abdominal pain and nausea.  ?All other systems reviewed and are negative. ? ?Physical Exam ?Updated Vital Signs ?BP 109/65   Pulse 78   Temp 98.6 ?F (37 ?C)   Resp 16   Ht 5\' 7"  (1.702 m)   Wt 56.7 kg   LMP 11/03/2021 (Exact Date)   SpO2 100%   BMI 19.58 kg/m?  ?Physical Exam ?Vitals and nursing note reviewed.  ?Constitutional:   ?   General: She is not in acute distress. ?   Appearance: Normal appearance.  ?   Comments: Patient appears somewhat uncomfortable, but not in acute distress  ?HENT:  ?   Head: Normocephalic and atraumatic.  ?Eyes:  ?   General:     ?   Right eye: No discharge.     ?   Left eye: No discharge.  ?Cardiovascular:  ?   Rate and Rhythm: Normal rate and regular rhythm.   ?   Heart sounds: No murmur heard. ?  No friction rub. No gallop.  ?Pulmonary:  ?   Effort: Pulmonary effort is normal.  ?   Breath sounds: Normal breath sounds.  ?Abdominal:  ?   General: Bowel sounds are normal.  ?   Palpations: Abdomen is soft.  ?   Comments: Tenderness to palpation most focally in the right lower quadrant, but present throughout the entire abdomen.  No rebound, rigidity, guarding.  Normal bowel sounds throughout.  ?Skin: ?   General: Skin is warm and dry.  ?   Capillary Refill: Capillary refill takes less than 2 seconds.  ?Neurological:  ?   Mental Status: She is alert and oriented to person, place, and time.  ?Psychiatric:     ?   Mood and Affect: Mood normal.     ?   Behavior: Behavior normal.  ? ? ?ED Results / Procedures / Treatments   ?Labs ?(all labs ordered are listed, but only abnormal results are displayed) ?Labs Reviewed  ?CBC - Abnormal; Notable for the following components:  ?    Result Value  ? Hemoglobin 11.6 (*)   ? All other components within normal limits  ?URINALYSIS, ROUTINE W REFLEX MICROSCOPIC - Abnormal; Notable  for the following components:  ? APPearance HAZY (*)   ? Hgb urine dipstick LARGE (*)   ? Ketones, ur 20 (*)   ? Leukocytes,Ua SMALL (*)   ? RBC / HPF >50 (*)   ? Non Squamous Epithelial 0-5 (*)   ? All other components within normal limits  ?LIPASE, BLOOD  ?COMPREHENSIVE METABOLIC PANEL  ?I-STAT BETA HCG BLOOD, ED ([facility], [facility], [facility] ONLY)  ? ? ?EKG ?None ? ?Radiology ?CT ABDOMEN PELVIS W CONTRAST ? ?Result Date: 11/07/2021 ?CLINICAL DATA:  Right lower quadrant abdominal pain EXAM: CT ABDOMEN AND PELVIS WITH CONTRAST TECHNIQUE: Multidetector CT imaging of the abdomen and pelvis was performed using the standard protocol following bolus administration of intravenous contrast. RADIATION DOSE REDUCTION: This exam was performed according to the departmental dose-optimization program which includes automated exposure control, adjustment of the mA and/or kV according to patient  size and/or use of iterative reconstruction technique. CONTRAST:  OMNIPAQUE IOHEXOL 300 MG/ML  SOLN COMPARISON:  None Available. FINDINGS: Lower chest: Unremarkable Hepatobiliary: Unremarkable Pancreas: Unremarkable Spleen: Unremarkable Adrenals/Urinary Tract: Unremarkable Stomach/Bowel: No compelling findings of appendicitis. No dilated bowel noted. There is formed stool in the colon. Vascular/Lymphatic: Unremarkable Reproductive: Serpentine cystic lesions along the adnexa suspicious for hydrosalpinx with adjacent ovarian cyst less likely. There is some accentuated enhancement along the cystic lesions on the right, cannot exclude pyosalpinx or tubo-ovarian abscess. No substantial thickening of the endometrium. Slight indistinctness of contours of the right ovary, cannot exclude inflammation. Other: Small amount of complex free fluid along the right cul-de-sac, internal density 17 Hounsfield units. Musculoskeletal: Unremarkable IMPRESSION: 1. Complex cystic lesions along both adnexa with a somewhat serpentine appearance raising the possibility of hydrosalpinx, and potentially with inflammatory findings along the right adnexa such that tubo-ovarian abscess, hematosalpinx, or pyosalpinx cannot be excluded. Small amount of complex free pelvic fluid. Correlate with any clinical findings suggestive of pelvic inflammatory disease. Pelvic sonography may be helpful in further workup if clinically indicated. 2. No appendiceal abnormality observed. Electronically Signed   By: Gaylyn Rong M.D.   On: 11/07/2021 13:57   ? ?Procedures ?Procedures  ? ? ?Medications Ordered in ED ?Medications  ?acetaminophen (TYLENOL) tablet 650 mg (650 mg Oral Given 11/07/21 0550)  ?alum & mag hydroxide-simeth (MAALOX/MYLANTA) 200-200-20 MG/5ML suspension 30 mL (30 mLs Oral Given 11/07/21 1207)  ?  And  ?lidocaine (XYLOCAINE) 2 % viscous mouth solution 15 mL (15 mLs Oral Given 11/07/21 1206)  ?ondansetron [facility]) injection 4 mg (4 mg  Intravenous Given 11/07/21 1214)  ?dicyclomine (BENTYL) capsule 10 mg (10 mg Oral Given 11/07/21 1207)  ?iohexol (OMNIPAQUE) 300 MG/ML solution 100 mL (100 mLs Intravenous Contrast Given 11/07/21 1347)  ?morphine (PF) 4 MG/ML injection 4 mg (4 mg Intravenous Given 11/07/21 1449)  ?ondansetron (ZOFRAN) injection 4 mg (4 mg Intravenous Given 11/07/21 1446)  ? ? ?ED Course/ Medical Decision Making/ A&P ?  ?                        ?Medical Decision Making ?Amount and/or Complexity of Data Reviewed ?Labs: ordered. ? ? ?This patient presents to the ED for concern of right lower quadrant pain, nausea, fever, this involves an extensive number of treatment options, and is a complaint that carries with it a high risk of complications and morbidity. The emergent differential diagnosis prior to evaluation includes, but is not limited to, UTI, pyelonephritis, acute appendicitis, other surgical or acute intrathoracic abdominal concerns, nephrolithiasis, PID, tubo-ovarian abscess versus  other.  ? ?This is not an exhaustive differential.  ? ?Past Medical History / Co-morbidities / Social History: ?No significant past medical history, no history of previous surgeries in the abdomen ? ?Additional history: ?Chart reviewed. Pertinent results include: previous ED, urgent care labwork, imaging. ? ?Physical Exam: ?Physical exam performed. The pertinent findings include: TTP throughout abdomen, significant TTP in RLQ. No rebound, rigidity, guarding. ? ?Lab Tests: ?I ordered, and personally interpreted labs.  The pertinent results include:  Unremarkable CMP, Lipase, CBC shows mild anemia, hemoglobin 11.6, UA shows large blood, patient is on period.  ?  ?Imaging Studies: ?I ordered imaging studies including CTAP. I independently visualized and interpreted imaging which showed cystic lesions, other tortuosity noted on adnexa raising concern for tubo-ovarian abscess versus hemosalpinx versus other.  Patient will need further evaluation with transvaginal  and pelvic ultrasound.  She continues to deny any vaginal discharge, dyspareunia, concern for STI.  I agree with the radiologist interpretation. ?  ?Medications: ?I ordered medication including zofran, GI cocktail,

## 2021-11-07 NOTE — Anesthesia Preprocedure Evaluation (Addendum)
Anesthesia Evaluation  ?Patient identified by MRN, date of birth, ID band ?Patient awake ? ? ? ?Reviewed: ?Allergy & Precautions, NPO status , Patient's Chart, lab work & pertinent test results ? ?Airway ?Mallampati: II ? ?TM Distance: >3 FB ?Neck ROM: Full ? ? ? Dental ?no notable dental hx. ? ?  ?Pulmonary ?Current Smoker and Patient abstained from smoking.,  ?  ?Pulmonary exam normal ? ? ? ? ? ? ? Cardiovascular ?negative cardio ROS ? ? ?Rhythm:Regular Rate:Normal ? ? ?  ?Neuro/Psych ?negative neurological ROS ? negative psych ROS  ? GI/Hepatic ?negative GI ROS, Neg liver ROS,   ?Endo/Other  ?negative endocrine ROS ? Renal/GU ?negative Renal ROS  ?negative genitourinary ?  ?Musculoskeletal ?negative musculoskeletal ROS ?(+)  ? Abdominal ?Normal abdominal exam  (+)   ?Peds ? Hematology ?negative hematology ROS ?(+)   ?Anesthesia Other Findings ? ? Reproductive/Obstetrics ?Ovarian torsion ? ?  ? ? ? ? ? ? ? ? ? ? ? ? ? ?  ?  ? ? ? ? ? ? ? ?Anesthesia Physical ?Anesthesia Plan ? ?ASA: 1 and emergent ? ?Anesthesia Plan: General  ? ?Post-op Pain Management:   ? ?Induction: Intravenous and Rapid sequence ? ?PONV Risk Score and Plan: 3 and Ondansetron, Dexamethasone, Midazolam and Treatment may vary due to age or medical condition ? ?Airway Management Planned: Mask and Oral ETT ? ?Additional Equipment: None ? ?Intra-op Plan:  ? ?Post-operative Plan: Extubation in OR ? ?Informed Consent: I have reviewed the patients History and Physical, chart, labs and discussed the procedure including the risks, benefits and alternatives for the proposed anesthesia with the patient or authorized representative who has indicated his/her understanding and acceptance.  ? ? ? ?Dental advisory given ? ?Plan Discussed with: CRNA ? ?Anesthesia Plan Comments: (Lab Results ?     Component                Value               Date                 ?     WBC                      6.7                 11/06/2021            ?     HGB                      11.6 (L)            11/06/2021           ?     HCT                      36.2                11/06/2021           ?     MCV                      90.7                11/06/2021           ?     PLT  261                 11/06/2021           ?Lab Results ?     Component                Value               Date                 ?     HCG                      <5.0                11/06/2021          )  ? ? ? ? ? ?Anesthesia Quick Evaluation ? ?

## 2021-11-07 NOTE — Transfer of Care (Signed)
Immediate Anesthesia Transfer of Care Note ? ?Patient: Caroline Lynch ? ?Procedure(s) Performed: DIAGNOSTIC LARYNGOSCOPY, lysis of adhesions, peritoneal wall biopsy ? ?Patient Location: PACU ? ?Anesthesia Type:General ? ?Level of Consciousness: awake, alert  and oriented ? ?Airway & Oxygen Therapy: Patient Spontanous Breathing and Patient connected to nasal cannula oxygen ? ?Post-op Assessment: Report given to RN and Post -op Vital signs reviewed and stable ? ?Post vital signs: Reviewed and stable ? ?Last Vitals:  ?Vitals Value Taken Time  ?BP 93/63 11/07/21 1916  ?Temp 97.7   ?Pulse 70 11/07/21 1921  ?Resp 11 11/07/21 1922  ?SpO2 100% 11/07/21 1921  ?Vitals shown include unvalidated device data. ? ?Last Pain:  ?Vitals:  ? 11/07/21 1727  ?TempSrc:   ?PainSc: 0-No pain  ?   ? ?  ? ?Complications: No notable events documented. ?

## 2021-11-07 NOTE — Anesthesia Procedure Notes (Addendum)
Procedure Name: Intubation ?Date/Time: 11/07/2021 6:10 PM ?Performed by: Santa Lighter, MD ?Pre-anesthesia Checklist: Patient identified, Emergency Drugs available, Suction available and Patient being monitored ?Patient Re-evaluated:Patient Re-evaluated prior to induction ?Oxygen Delivery Method: Circle System Utilized ?Preoxygenation: Pre-oxygenation with 100% oxygen ?Induction Type: IV induction ?Ventilation: Mask ventilation without difficulty ?Laryngoscope Size: Mac and 3 ?Grade View: Grade II ?Tube type: Oral ?Tube size: 7.0 mm ?Number of attempts: 2 ?Airway Equipment and Method: Stylet and Oral airway ?Placement Confirmation: ETT inserted through vocal cords under direct vision, positive ETCO2 and breath sounds checked- equal and bilateral ?Secured at: 23 cm ?Tube secured with: Tape ?Dental Injury: Teeth and Oropharynx as per pre-operative assessment  ?Comments: Attempt x1 by CRNA with esophageal intubation.  Attempt x1 by MDA with grade 2 view, ETT placed atraumatically. +BBS, +EtCo2. ? ?S. Gifford Shave, MD ? ? ? ? ?

## 2021-11-08 ENCOUNTER — Encounter (HOSPITAL_COMMUNITY): Payer: Self-pay | Admitting: Family Medicine

## 2021-11-09 ENCOUNTER — Other Ambulatory Visit: Payer: Self-pay | Admitting: Family Medicine

## 2021-11-11 ENCOUNTER — Other Ambulatory Visit (INDEPENDENT_AMBULATORY_CARE_PROVIDER_SITE_OTHER): Payer: Self-pay | Admitting: Family Medicine

## 2021-11-12 ENCOUNTER — Telehealth: Payer: Self-pay | Admitting: Family Medicine

## 2021-11-12 LAB — SURGICAL PATHOLOGY

## 2021-11-12 NOTE — Telephone Encounter (Signed)
Called patient to get an appointment scheduled, there was no answer to the phone call and the mailbox was full. A letter was mailed to the patient.  ?

## 2021-11-26 ENCOUNTER — Encounter: Payer: Self-pay | Admitting: Family Medicine

## 2021-11-27 ENCOUNTER — Ambulatory Visit (INDEPENDENT_AMBULATORY_CARE_PROVIDER_SITE_OTHER): Payer: BC Managed Care – PPO | Admitting: General Practice

## 2021-11-27 VITALS — BP 106/75 | HR 67 | Ht 67.0 in | Wt 125.0 lb

## 2021-11-27 DIAGNOSIS — Z5189 Encounter for other specified aftercare: Secondary | ICD-10-CM

## 2021-11-27 NOTE — Progress Notes (Signed)
Patient presents to office today with concern regarding incision site- patient had surgery 5/4. She states she noticed the incision around her belly button looking "raised" compared to the other sites. Incisions appear to be healing well & are approximated- scab noted over navel incision but appears clean/dry. Reviewed wound care with patient. Patient will follow up on 6/22 for post op visit.   Chase Caller RN BSN 11/27/21

## 2021-12-26 ENCOUNTER — Encounter: Payer: BC Managed Care – PPO | Admitting: Family Medicine

## 2022-03-20 ENCOUNTER — Ambulatory Visit
Admission: EM | Admit: 2022-03-20 | Discharge: 2022-03-20 | Disposition: A | Discharge status: Home / Self Care | Payer: BC Managed Care – PPO | Attending: Emergency Medicine | Admitting: Emergency Medicine

## 2022-03-20 DIAGNOSIS — J020 Streptococcal pharyngitis: Secondary | ICD-10-CM

## 2022-03-20 LAB — POCT RAPID STREP A (OFFICE): Rapid Strep A Screen: POSITIVE — AB

## 2022-03-20 MED ORDER — AMOXICILLIN 500 MG PO CAPS: 1000.0000 mg | ORAL_CAPSULE | Freq: Every day | ORAL | 0 refills | Status: AC

## 2022-03-20 NOTE — ED Provider Notes (Addendum)
[facility]    CSN: [id number] Arrival date & time: 03/20/22  1248    HISTORY  No chief complaint on file.  HPI Caroline Lynch is a pleasant, 26 y.o. female who presents to urgent care today. Patient presents urgent care complaining of fever, sore throat and nasal congestion.  Patient has a temperature of 99.9 on arrival with otherwise normal vital signs today.  Patient reports feeling like her voice is hoarse and pain with swallowing.  Patient denies known sick contacts.  Patient denies nausea, vomiting, diarrhea, body aches, chills, headache, rash, loss of taste or smell.  The history is provided by the patient.   No past medical history on file. Patient Active Problem List   Diagnosis Date Noted   Ovarian cyst 11/07/2021   Past Surgical History:  Procedure Laterality Date   DIAGNOSTIC LARYNGOSCOPY  11/07/2021   Procedure: DIAGNOSTIC LARYNGOSCOPY, lysis of adhesions, peritoneal wall biopsy;  Surgeon: Donnamae Jude, MD;  Location: [facility] OR;  Service: Gynecology;;   OB History   No obstetric history on file.    Home Medications    Prior to Admission medications   Medication Sig Start Date End Date Taking? Authorizing Provider  amoxicillin (AMOXIL) 500 MG capsule Take 2 capsules (1,000 mg total) by mouth daily for 10 days. 03/20/22 03/30/22 Yes Lynden Oxford Scales, PA-C    Family History No family history on file. Social History Social History   Tobacco Use   Smoking status: Some Days    Types: Cigarettes, E-cigarettes   Smokeless tobacco: Never  Vaping Use   Vaping Use: Every day  Substance Use Topics   Alcohol use: Yes    Comment: occasional   Drug use: Yes    Types: Marijuana    Comment: occasional   Allergies   Patient has no known allergies.  Review of Systems Review of Systems Pertinent findings revealed after performing a 14 point review of systems has been noted in the history of present illness.  Physical Exam Triage Vital Signs ED Triage  Vitals  Enc Vitals Group     BP 05/03/21 0827 (!) 147/82     Pulse Rate 05/03/21 0827 72     Resp 05/03/21 0827 18     Temp 05/03/21 0827 98.3 F (36.8 C)     Temp Source 05/03/21 0827 Oral     SpO2 05/03/21 0827 98 %     Weight --      Height --      Head Circumference --      Peak Flow --      Pain Score 05/03/21 0826 5     Pain Loc --      Pain Edu? --      Excl. in [place]? --   No data found.  Updated Vital Signs BP 117/77 (BP Location: Left Arm)   Pulse 87   Temp 99.9 F (37.7 C) (Oral)   Resp 16   SpO2 97%   Physical Exam Constitutional:      General: She is not in acute distress.    Appearance: She is well-developed. She is ill-appearing. She is not toxic-appearing.  HENT:     Head: Normocephalic and atraumatic.     Salivary Glands: Right salivary gland is diffusely enlarged and tender. Left salivary gland is diffusely enlarged and tender.     Right Ear: Hearing and external ear normal.     Left Ear: Hearing and external ear normal.     Ears:  Comments: Bilateral EACs with mild erythema, bilateral TMs are normal    Nose: No mucosal edema, congestion or rhinorrhea.     Right Turbinates: Not enlarged, swollen or pale.     Left Turbinates: Not enlarged or swollen.     Right Sinus: No maxillary sinus tenderness or frontal sinus tenderness.     Left Sinus: No maxillary sinus tenderness or frontal sinus tenderness.     Mouth/Throat:     Lips: Pink. No lesions.     Mouth: Mucous membranes are moist. No oral lesions or angioedema.     Dentition: No gingival swelling.     Tongue: No lesions.     Palate: No mass.     Pharynx: Uvula midline. Pharyngeal swelling, oropharyngeal exudate and posterior oropharyngeal erythema present. No uvula swelling.     Tonsils: Tonsillar exudate present. 2+ on the right. 2+ on the left.  Eyes:     Extraocular Movements: Extraocular movements intact.     Conjunctiva/sclera: Conjunctivae normal.     Pupils: Pupils are equal, round, and  reactive to light.  Neck:     Thyroid: No thyroid mass, thyromegaly or thyroid tenderness.     Trachea: Tracheal tenderness present. No abnormal tracheal secretions or tracheal deviation.     Comments: Voice is muffled Cardiovascular:     Rate and Rhythm: Normal rate and regular rhythm.     Pulses: Normal pulses.     Heart sounds: Normal heart sounds, S1 normal and S2 normal. No murmur heard.    No friction rub. No gallop.  Pulmonary:     Effort: Pulmonary effort is normal. No accessory muscle usage, prolonged expiration, respiratory distress or retractions.     Breath sounds: No stridor, decreased air movement or transmitted upper airway sounds. No decreased breath sounds, wheezing, rhonchi or rales.  Abdominal:     General: Bowel sounds are normal.     Palpations: Abdomen is soft.     Tenderness: There is generalized abdominal tenderness. There is no right CVA tenderness, left CVA tenderness or rebound. Negative signs include Murphy's sign.     Hernia: No hernia is present.  Musculoskeletal:        General: No tenderness. Normal range of motion.     Cervical back: Full passive range of motion without pain, normal range of motion and neck supple.     Right lower leg: No edema.     Left lower leg: No edema.  Lymphadenopathy:     Cervical: Cervical adenopathy present.     Right cervical: Superficial cervical adenopathy present.     Left cervical: Superficial cervical adenopathy present.  Skin:    General: Skin is warm and dry.     Findings: No erythema, lesion or rash.  Neurological:     General: No focal deficit present.     Mental Status: She is alert and oriented to person, place, and time. Mental status is at baseline.  Psychiatric:        Mood and Affect: Mood normal.        Behavior: Behavior normal.        Thought Content: Thought content normal.        Judgment: Judgment normal.     Visual Acuity Right Eye Distance:   Left Eye Distance:   Bilateral Distance:     Right Eye Near:   Left Eye Near:    Bilateral Near:     [facility] Couse / Diagnostics / Procedures:     Radiology No  results found.  Procedures Procedures (including critical care time) EKG  Pending results:  Labs Reviewed  POCT RAPID STREP A (OFFICE) - Abnormal; Notable for the following components:      Result Value   Rapid Strep A Screen Positive (*)    All other components within normal limits    Medications Ordered in UC: Medications - No data to display  UC Diagnoses / Final Clinical Impressions(s)   I have reviewed the triage vital signs and the nursing notes.  Pertinent labs & imaging results that were available during my care of the patient were reviewed by me and considered in my medical decision making (see chart for details).    Final diagnoses:  Streptococcal pharyngitis   Patient will be treated for streptococcal pharyngitis due to positive rapid strep test today.  Supportive care recommended.  Return precautions advised.  ED Prescriptions     Medication Sig Dispense Auth. Provider   amoxicillin (AMOXIL) 500 MG capsule Take 2 capsules (1,000 mg total) by mouth daily for 10 days. 20 capsule Lynden Oxford Scales, PA-C      PDMP not reviewed this encounter.  Disposition Upon Discharge:  Condition: stable for discharge home Home: take medications as prescribed; routine discharge instructions as discussed; follow up as advised.  Patient presented with an acute illness with associated systemic symptoms and significant discomfort requiring urgent management. In my opinion, this is a condition that a prudent lay person (someone who possesses an average knowledge of health and medicine) may potentially expect to result in complications if not addressed urgently such as respiratory distress, impairment of bodily function or dysfunction of bodily organs.   Routine symptom specific, illness specific and/or disease specific instructions were discussed with the  patient and/or caregiver at length.   As such, the patient has been evaluated and assessed, work-up was performed and treatment was provided in alignment with urgent care protocols and evidence based medicine.  Patient/parent/caregiver has been advised that the patient may require follow up for further testing and treatment if the symptoms continue in spite of treatment, as clinically indicated and appropriate.  If the patient was tested for COVID-19, Influenza and/or RSV, then the patient/parent/guardian was advised to isolate at home pending the results of his/her diagnostic coronavirus test and potentially longer if they're positive. I have also advised pt that if his/her COVID-19 test returns positive, it's recommended to self-isolate for at least 10 days after symptoms first appeared AND until fever-free for 24 hours without fever reducer AND other symptoms have improved or resolved. Discussed self-isolation recommendations as well as instructions for household member/close contacts as per the [facility] and [place] DHHS, and also gave patient the [place] packet with this information.  Patient/parent/caregiver has been advised to return to the [facility] or PCP in 3-5 days if no better; to PCP or the Emergency Department if new signs and symptoms develop, or if the current signs or symptoms continue to change or worsen for further workup, evaluation and treatment as clinically indicated and appropriate  The patient will follow up with their current PCP if and as advised. If the patient does not currently have a PCP we will assist them in obtaining one.   The patient may need specialty follow up if the symptoms continue, in spite of conservative treatment and management, for further workup, evaluation, consultation and treatment as clinically indicated and appropriate.  Patient/parent/caregiver verbalized understanding and agreement of plan as discussed.  All questions were addressed during visit.  Please see discharge  instructions below for further details of plan.  Discharge Instructions:   Discharge Instructions      Your strep test today is positive.  I recommend that you begin antibiotics now for treatment.  I have sent a prescription for amoxicillin to your pharmacy.  Please take all doses as prescribed.    After 24 hours of antibiotics, please discard your toothbrush as well as any other oral devices that you are currently using and replace them with new ones to avoid reinfection.  You should begin to feel better in 24 to 48 hours.   Once you have been on antibiotics for a full 24 hours, you are no longer considered contagious.   I have provided you with a note to return to work.   Even if you are feeling better, please make sure that you finish the full 10-day course and do not skip any doses.  Failure to complete a full course of antibiotics for strep throat can result in worsening infection that may require longer treatment with stronger antibiotics.   Thank you for visiting urgent care today.    This office note has been dictated using Teaching laboratory technician.  Unfortunately, this method of dictation can sometimes lead to typographical or grammatical errors.  I apologize for your inconvenience in advance if this occurs.  Please do not hesitate to reach out to me if clarification is needed.      Theadora Rama Scales, PA-C 03/20/22 1420    Theadora Rama [place], [place] 03/20/22 1528

## 2022-03-20 NOTE — ED Triage Notes (Signed)
Triaged by provider  

## 2022-03-20 NOTE — Discharge Instructions (Signed)
Your strep test today is positive.  I recommend that you begin antibiotics now for treatment.  I have sent a prescription for amoxicillin to your pharmacy.  Please take all doses as prescribed.    After 24 hours of antibiotics, please discard your toothbrush as well as any other oral devices that you are currently using and replace them with new ones to avoid reinfection.  You should begin to feel better in 24 to 48 hours.   Once you have been on antibiotics for a full 24 hours, you are no longer considered contagious.   I have provided you with a note to return to work.   Even if you are feeling better, please make sure that you finish the full 10-day course and do not skip any doses.  Failure to complete a full course of antibiotics for strep throat can result in worsening infection that may require longer treatment with stronger antibiotics.   Thank you for visiting urgent care today.

## 2023-01-16 ENCOUNTER — Ambulatory Visit (INDEPENDENT_AMBULATORY_CARE_PROVIDER_SITE_OTHER): Payer: BC Managed Care – PPO | Admitting: Obstetrics and Gynecology

## 2023-01-16 ENCOUNTER — Other Ambulatory Visit (HOSPITAL_COMMUNITY)
Admission: RE | Admit: 2023-01-16 | Discharge: 2023-01-16 | Disposition: A | Discharge status: Home / Self Care | Payer: BC Managed Care – PPO | Source: Ambulatory Visit | Attending: Obstetrics and Gynecology | Admitting: Obstetrics and Gynecology

## 2023-01-16 ENCOUNTER — Encounter: Payer: Self-pay | Admitting: Obstetrics and Gynecology

## 2023-01-16 VITALS — BP 120/78 | HR 66 | Wt 127.0 lb

## 2023-01-16 DIAGNOSIS — N898 Other specified noninflammatory disorders of vagina: Secondary | ICD-10-CM

## 2023-01-16 DIAGNOSIS — B9689 Other specified bacterial agents as the cause of diseases classified elsewhere: Secondary | ICD-10-CM | POA: Insufficient documentation

## 2023-01-16 DIAGNOSIS — Z113 Encounter for screening for infections with a predominantly sexual mode of transmission: Secondary | ICD-10-CM | POA: Insufficient documentation

## 2023-01-16 DIAGNOSIS — N809 Endometriosis, unspecified: Secondary | ICD-10-CM | POA: Diagnosis not present

## 2023-01-16 DIAGNOSIS — N760 Acute vaginitis: Secondary | ICD-10-CM | POA: Insufficient documentation

## 2023-01-16 MED ORDER — DROSPIRENONE-ETHINYL ESTRADIOL 3-0.02 MG PO TABS: 1.0000 | ORAL_TABLET | Freq: Every day | ORAL | 11 refills | Status: DC

## 2023-01-16 NOTE — Progress Notes (Signed)
    GYNECOLOGY VISIT  Patient name: Caroline Lynch MRN [id number]  Date of birth: 06-Aug-1995 Chief Complaint:   Endometriosis  History:  Caroline Lynch is a 27 y.o. No obstetric history on file. being seen today for endometriosis management.   Has been continuing to have pain with menses Endometriosis diagnosed on DX [facility] about 1 year ago Was on nuvaring when 27 years old Female partner, sexaully active; occasional pain with intercourse  No pain with urinartion and pain with BM only on her period  Menses are regular No CHC contraindications Notes intermittent non-cyclic pain; last episode of pain ~ 2 weeks ago   No past medical history on file.  Past Surgical History:  Procedure Laterality Date   DIAGNOSTIC LARYNGOSCOPY  11/07/2021   Procedure: DIAGNOSTIC LARYNGOSCOPY, lysis of adhesions, peritoneal wall biopsy;  Surgeon: Reva Bores, MD;  Location: [facility] OR;  Service: Gynecology;;    The following portions of the patient's history were reviewed and updated as appropriate: allergies, current medications, past family history, past medical history, past social history, past surgical history and problem list.   Health Maintenance:   Last pap No results found for: "DIAGPAP", "HPVHIGH", "ADEQPAP"  High Risk HPV: Positive  Adequacy:  Satisfactory for evaluation, transformation zone component PRESENT  Diagnosis:  Atypical squamous cells of undetermined significance (ASC-US)  Last mammogram: n/a   Review of Systems:  Pertinent items are noted in HPI. Comprehensive review of systems was otherwise negative.   Objective:  Physical Exam BP 120/78   Pulse 66   Wt 127 lb (57.6 kg)   LMP 01/12/2023 (Exact Date)   BMI 19.89 kg/m    Physical Exam Vitals and nursing note reviewed.  Constitutional:      Appearance: Normal appearance.  HENT:     Head: Normocephalic and atraumatic.  Pulmonary:     Effort: Pulmonary effort is normal.  Abdominal:     Comments: Soft, mild lower abdominal  tenderness  Skin:    General: Skin is warm and dry.  Neurological:     General: No focal deficit present.     Mental Status: She is alert.  Psychiatric:        Mood and Affect: Mood normal.        Behavior: Behavior normal.        Thought Content: Thought content normal.        Judgment: Judgment normal.        Assessment & Plan:   1. Endometriosis Discussed management of endometriosis, including menstrual suppression and analgesia. Patient later noted having concerns with mood swings and acne with her menses. Will trial yaz for suppression. Discussed continuous intermittent and she would like to start with typical dose administration.  - drospirenone-ethinyl estradiol (YAZ) 3-0.02 MG tablet; Take 1 tablet by mouth daily.  Dispense: 28 tablet; Refill: 11 - US PELVIC COMPLETE WITH TRANSVAGINAL; Future  2. Vaginal discharge Self swab completed - Cervicovaginal ancillary only( [place]) - RPR+HBsAg+HCVAb+...  3. Routine screening for STI (sexually transmitted infection) - RPR+HBsAg+HCVAb+...   Routine preventative health maintenance measures emphasized.  Lorriane Shire, MD [facility] for [facility], [facility]

## 2023-01-17 ENCOUNTER — Ambulatory Visit (HOSPITAL_BASED_OUTPATIENT_CLINIC_OR_DEPARTMENT_OTHER)
Admission: RE | Admit: 2023-01-17 | Discharge: 2023-01-17 | Disposition: A | Discharge status: Home / Self Care | Payer: BC Managed Care – PPO | Source: Ambulatory Visit | Attending: Obstetrics and Gynecology | Admitting: Obstetrics and Gynecology

## 2023-01-17 DIAGNOSIS — N809 Endometriosis, unspecified: Secondary | ICD-10-CM | POA: Insufficient documentation

## 2023-01-19 ENCOUNTER — Other Ambulatory Visit: Payer: Self-pay | Admitting: Obstetrics and Gynecology

## 2023-01-19 DIAGNOSIS — N760 Acute vaginitis: Secondary | ICD-10-CM

## 2023-01-19 LAB — CERVICOVAGINAL ANCILLARY ONLY
Bacterial Vaginitis (gardnerella): POSITIVE — AB
Candida Glabrata: NEGATIVE
Candida Vaginitis: NEGATIVE
Chlamydia: NEGATIVE
Comment: NEGATIVE
Comment: NEGATIVE
Comment: NEGATIVE
Comment: NEGATIVE
Comment: NEGATIVE
Comment: NORMAL
Neisseria Gonorrhea: NEGATIVE
Trichomonas: NEGATIVE

## 2023-01-19 MED ORDER — METRONIDAZOLE 500 MG PO TABS: 500.0000 mg | ORAL_TABLET | Freq: Two times a day (BID) | ORAL | 0 refills | Status: AC

## 2023-01-28 ENCOUNTER — Telehealth: Payer: Self-pay

## 2023-01-28 NOTE — Telephone Encounter (Signed)
-----   Message from Jerene Bears sent at 01/28/2023  6:40 AM EDT ----- Please let pt know her ultrasound showed possible endometriosis on both ovaries and findings c/w endometriomas.  She has started [place] but is likely going to need surgical treatment for this.  She has follow up with Dr. Briscoe Deutscher on 8/16 and can discuss more at that time.  Leda Quail, MD, covering for Dr. Briscoe Deutscher

## 2023-01-28 NOTE — Telephone Encounter (Signed)
Left message for patient to return call to office. Jennifer Howard  RN 

## 2023-02-20 ENCOUNTER — Encounter: Payer: Self-pay | Admitting: Obstetrics and Gynecology

## 2023-02-20 ENCOUNTER — Ambulatory Visit (INDEPENDENT_AMBULATORY_CARE_PROVIDER_SITE_OTHER): Payer: BC Managed Care – PPO | Admitting: Obstetrics and Gynecology

## 2023-02-20 VITALS — BP 117/73 | HR 68 | Wt 127.0 lb

## 2023-02-20 DIAGNOSIS — N809 Endometriosis, unspecified: Secondary | ICD-10-CM | POA: Diagnosis not present

## 2023-02-20 MED ORDER — ETONOGESTREL-ETHINYL ESTRADIOL 0.12-0.015 MG/24HR VA RING: VAGINAL_RING | VAGINAL | 4 refills | Status: DC

## 2023-02-20 NOTE — Progress Notes (Signed)
    GYNECOLOGY VISIT  Patient name: Caroline Lynch MRN [id number]  Date of birth: 03-Nov-1995 Chief Complaint:   Endometriosis  History:  Caroline Lynch is a 27 y.o. No obstetric history on file. being seen today for [name] follow up of endometriomas.  Has not been the most consistent with taking the pill, will miss the pill once or twice a week. Would be interested to switching to nuvaring. Does not want surgery at this time if she doesn't have to and would rather wait for now.   No past medical history on file.  Past Surgical History:  Procedure Laterality Date   DIAGNOSTIC LARYNGOSCOPY  11/07/2021   Procedure: DIAGNOSTIC LARYNGOSCOPY, lysis of adhesions, peritoneal wall biopsy;  Surgeon: Reva Bores, MD;  Location: [facility] OR;  Service: Gynecology;;    The following portions of the patient's history were reviewed and updated as appropriate: allergies, current medications, past family history, past medical history, past social history, past surgical history and problem list.   Health Maintenance:   Last pap No results found for: "DIAGPAP", "HPVHIGH", "ADEQPAP" Last mammogram: n/a   Review of Systems:  Pertinent items are noted in HPI. Comprehensive review of systems was otherwise negative.   Objective:  Physical Exam BP 117/73   Pulse 68   Wt 127 lb (57.6 kg)   LMP 02/12/2023 (Exact Date)   BMI 19.89 kg/m    Physical Exam Vitals and nursing note reviewed.  Constitutional:      Appearance: Normal appearance.  HENT:     Head: Normocephalic and atraumatic.  Pulmonary:     Effort: Pulmonary effort is normal.  Skin:    General: Skin is warm and dry.  Neurological:     General: No focal deficit present.     Mental Status: She is alert.  Psychiatric:        Mood and Affect: Mood normal.        Behavior: Behavior normal.        Thought Content: Thought content normal.        Judgment: Judgment normal.          Assessment & Plan:   1. Endometriosis Reviewed imaging and  noted that there are bilateral atrial mass, and likely have enlarged since prior surgery without menstrual suppression.  Reviewed that endometriosis typically just enlarge, and do not resolve spontaneously and therefore treatment is surgical removal.  Can also proceed with menstrual suppression to try to prevent enlargement of endometriomas.  Would like to hold off on surgery for now.  Will switch suppression from pills to NuvaRing, with continuous use.  Patient will reach out if she is ready for surgery.  Also noted that if endometrioma was grow larger, there is possibility that there is less normal ovary remaining. - etonogestrel-ethinyl estradiol (NUVARING) 0.12-0.015 MG/24HR vaginal ring; Insert vaginally and leave in place for 4 consecutive weeks, then swtich  Dispense: 3 each; Refill: 4  Noted to not have Pap on file, will offer at follow-up visit/annual  Routine preventative health maintenance measures emphasized.  Lorriane Shire, MD Minimally Invasive [facility]necologic [facility] for [facility], [facility]

## 2023-06-19 IMAGING — US US PELVIS COMPLETE TRANSABD/TRANSVAG W DUPLEX AND/OR DOPPLER
1 series · 13 of 25 positions shown · non-contrast
Comparison: CT abdomen 11/07/2021

CLINICAL DATA: Rule out ovarian torsion

EXAM:
TRANSABDOMINAL AND TRANSVAGINAL ULTRASOUND OF PELVIS
DOPPLER ULTRASOUND OF OVARIES
TECHNIQUE: Both transabdominal and transvaginal ultrasound examinations of the
pelvis were performed. Transabdominal technique was performed for
global imaging of the pelvis including uterus, ovaries, adnexal
regions, and pelvic cul-de-sac.
It was necessary to proceed with endovaginal exam following the
transabdominal exam to visualize the endometrium and ovaries. Color
and duplex Doppler ultrasound was utilized to evaluate blood flow to
the ovaries.

[Series 1: us pelvic complete w transvaginal and torsion righ · 111 acquisitions, 13 frames shown]
[im 1/111]
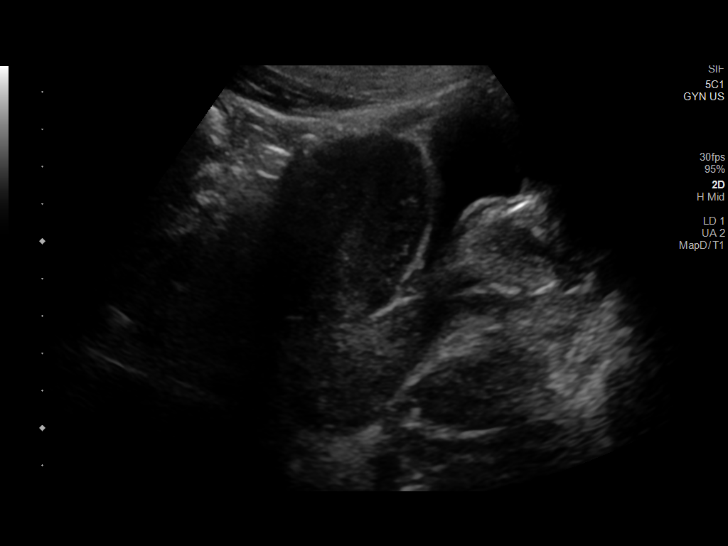
[im 10/111]
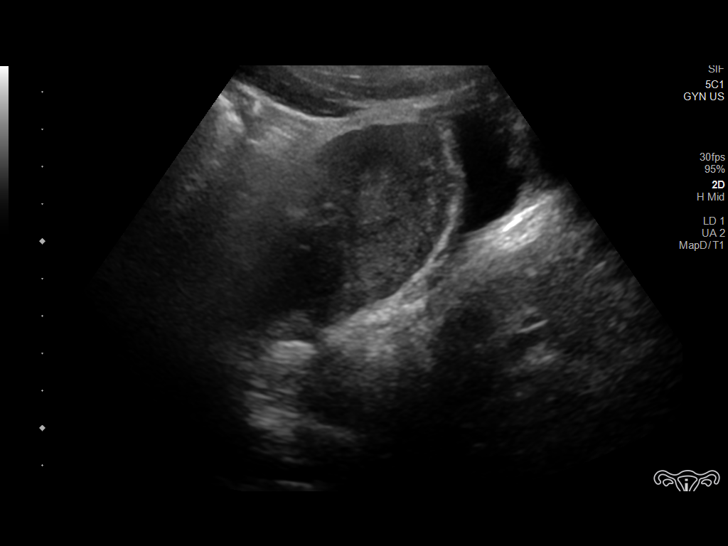
[im 19/111]
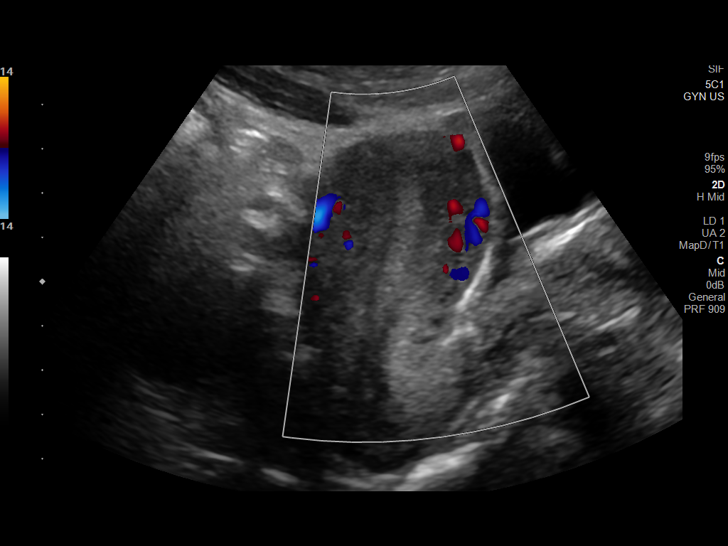
[im 28/111]
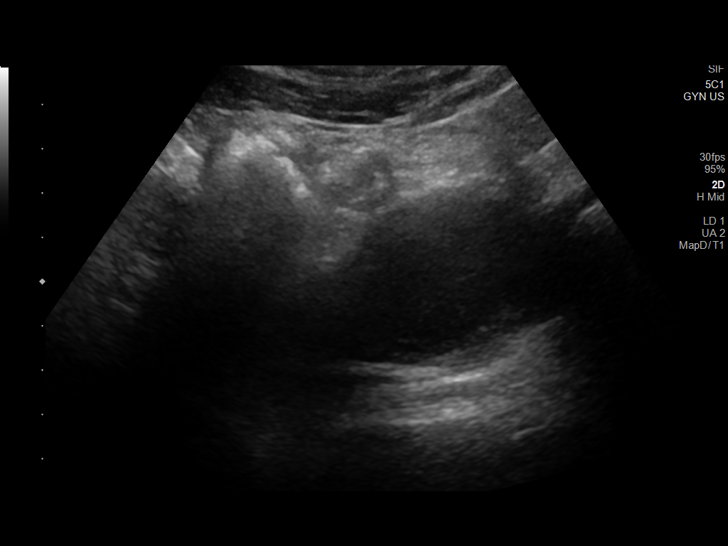
[im 37/111]
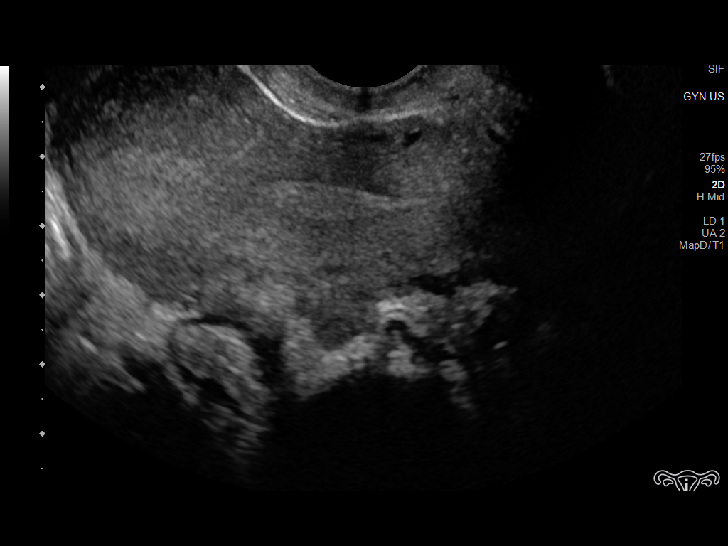
[im 46/111]
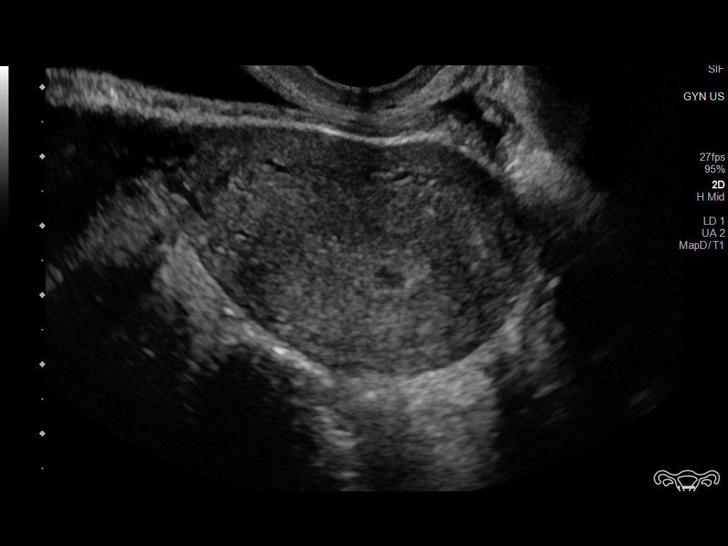
[im 56/111]
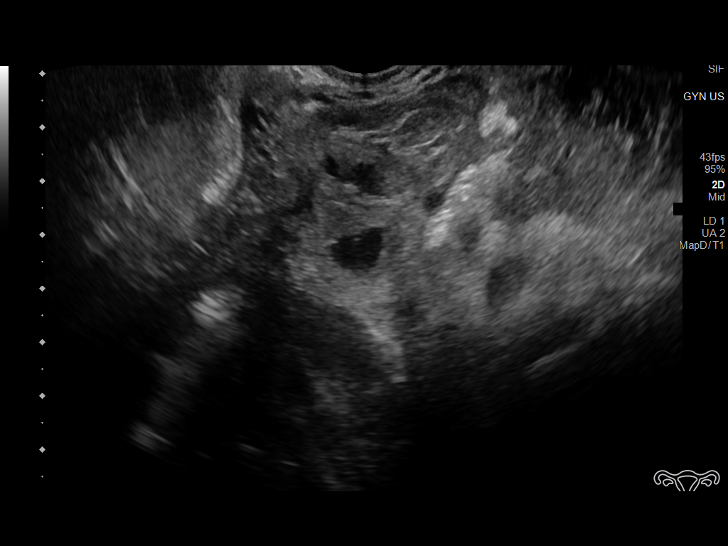
[im 65/111]
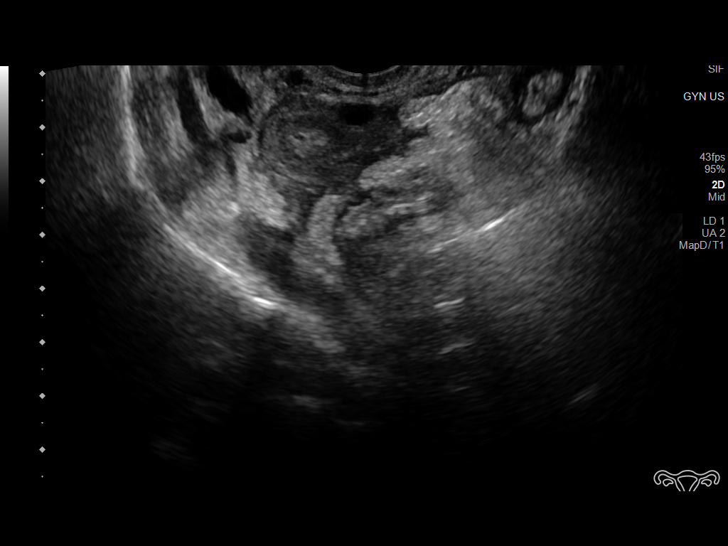
[im 74/111]
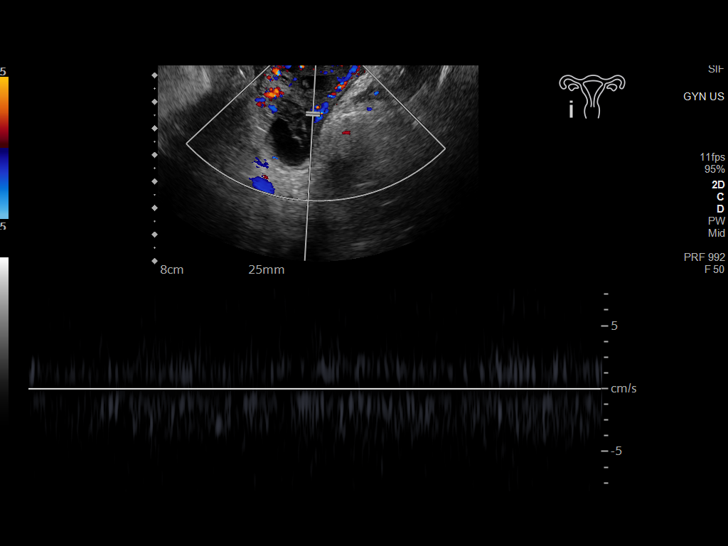
[im 83/111]
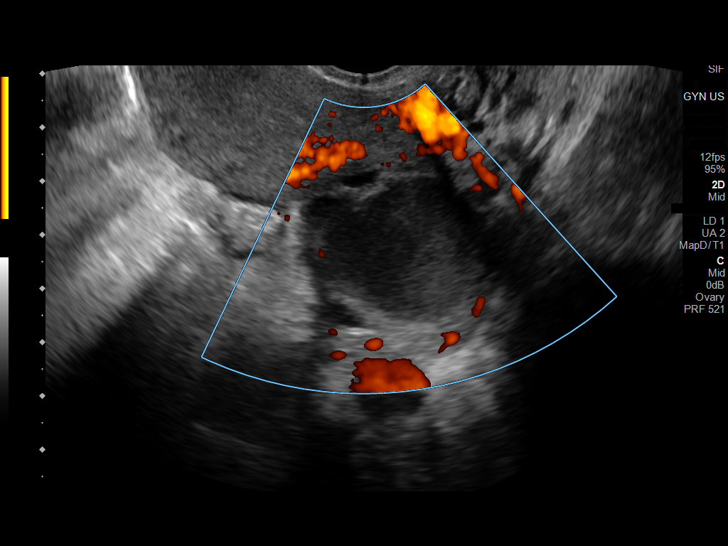
[im 92/111]
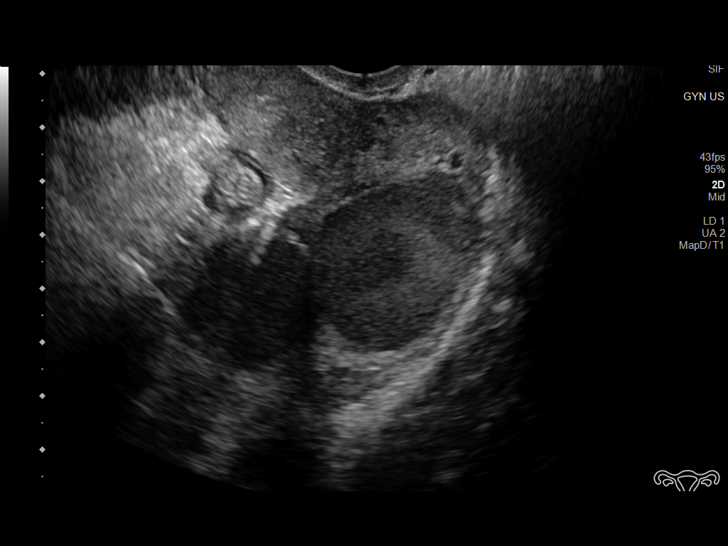
[im 101/111]
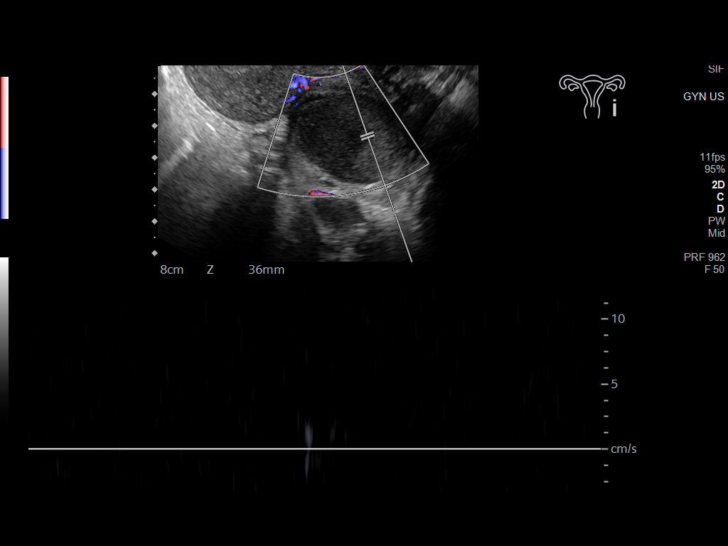
[im 111/111]
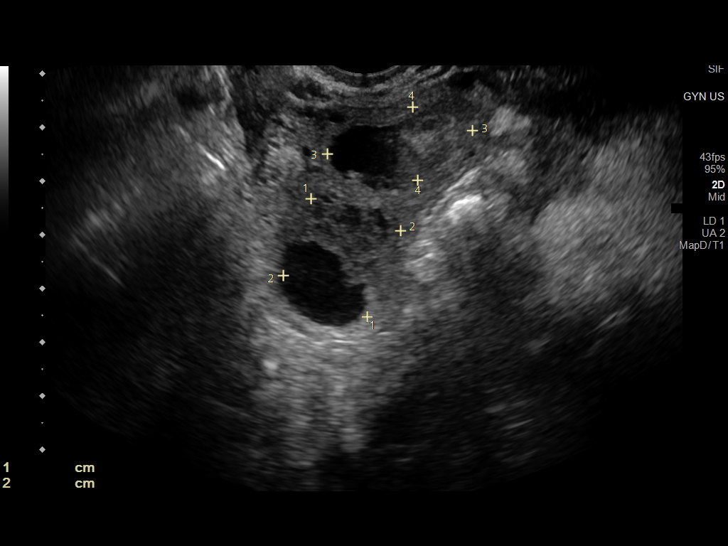

[13 of 25 positions shown; findings below may reference images not displayed]

FINDINGS: Uterus

Measurements: 8.2 x 3.8 x 4.7 cm = volume: 77.3 mL. No fibroids or
other mass visualized.

Endometrium

Thickness: 5.6 mm.  No focal abnormality visualized.

Right ovary

Measurements: 5.4 x 4.4 x 3.9 cm = volume: 48.8 mL. Two hypoechoic
avascular right ovarian cystic masses measuring0.7 and 2.4 cm
respectively which may reflect involuting follicles.

Left ovary

Measurements: 4 x 2.7 x 3 cm = volume: 16.2 mL. 2.4 x 2.2 x 2.6 cm
hypoechoic left ovarian avascular mass.

Pulsed Doppler evaluation of the right ovary was performed and
demonstrated normal normal low-resistance arterial and venous
waveforms.

Pulsed Doppler evaluation of the left ovary was performed and
demonstrated no arterial or venous waveforms.

Other findings

No abnormal free fluid.
IMPRESSION: 1. Overall findings are most concerning for left ovarian torsion.
Relative hypoechoic area in the left ovary may reflect an area of
hemorrhagic ovarian infarct.
2. No right ovarian torsion.

Critical Value/emergent results were called by telephone at the time
of interpretation on 11/07/2021 at [DATE] to provider Dr. Jamcaale,
who verbally acknowledged these results.

## 2023-08-14 ENCOUNTER — Ambulatory Visit: Payer: BC Managed Care – PPO | Admitting: Obstetrics and Gynecology

## 2023-08-31 ENCOUNTER — Other Ambulatory Visit (HOSPITAL_COMMUNITY)
Admission: RE | Admit: 2023-08-31 | Discharge: 2023-08-31 | Disposition: A | Discharge status: Home / Self Care | Payer: Medicaid Other | Source: Ambulatory Visit | Attending: Obstetrics and Gynecology | Admitting: Obstetrics and Gynecology

## 2023-08-31 ENCOUNTER — Ambulatory Visit (INDEPENDENT_AMBULATORY_CARE_PROVIDER_SITE_OTHER): Payer: BC Managed Care – PPO | Admitting: Obstetrics and Gynecology

## 2023-08-31 VITALS — BP 138/87 | HR 65 | Ht 67.0 in | Wt 130.0 lb

## 2023-08-31 DIAGNOSIS — N809 Endometriosis, unspecified: Secondary | ICD-10-CM | POA: Diagnosis not present

## 2023-08-31 DIAGNOSIS — Z124 Encounter for screening for malignant neoplasm of cervix: Secondary | ICD-10-CM | POA: Diagnosis present

## 2023-08-31 DIAGNOSIS — Z01419 Encounter for gynecological examination (general) (routine) without abnormal findings: Secondary | ICD-10-CM | POA: Diagnosis not present

## 2023-08-31 DIAGNOSIS — Z3009 Encounter for other general counseling and advice on contraception: Secondary | ICD-10-CM

## 2023-08-31 DIAGNOSIS — Z113 Encounter for screening for infections with a predominantly sexual mode of transmission: Secondary | ICD-10-CM

## 2023-08-31 MED ORDER — ETONOGESTREL-ETHINYL ESTRADIOL 0.12-0.015 MG/24HR VA RING: VAGINAL_RING | VAGINAL | 11 refills | Status: DC

## 2023-08-31 NOTE — Progress Notes (Signed)
 ANNUAL EXAM Patient name: Caroline Lynch MRN [id number]  Date of birth: Jun 15, 1996 Chief Complaint:   Gynecologic Exam (Painful periods, not on birth control, using condoms.)  History of Present Illness:   Caroline Lynch is a 28 y.o. No obstetric history on file. being seen today for a routine annual exam.  Current complaints: pap,   Discussed the use of AI scribe software for clinical note transcription with the patient, who gave verbal consent to proceed.  History of Present Illness   Caroline Lynch is a 28 year old female with endometriosis who presents with painful periods and concerns about birth control options.  She experiences ongoing painful periods and is concerned about managing her endometriosis. She has been diagnosed with endometriosis and is aware of the presence of endometriosis cysts. She is concerned about the potential progression of the cysts and endometriosis, especially since she has continued to have menstrual cycles. She is not currently trying to conceive and is focused on managing her symptoms, particularly the pain associated with her periods.  She has not been using the NuvaRing due to fears about potential side effects, including weight gain, acne, and mood swings. She previously used the NuvaRing at age 57, which helped regulate her periods, but she is uncertain if it contributed to feeling 'weird' due to concurrent life stressors. Since last visit, her periods have been extremely painful, described as 'straight from the pits of hell'. She is considering resuming the NuvaRing to manage her symptoms.  She is sexually active with a female partner and reports occasional pain with intercourse. She has never had a Pap smear before and is unfamiliar with its purpose. She is interested in STD testing, including tests for gonorrhea, chlamydia, trichomonas, HIV, syphilis, and hepatitis. No breast or nipple changes.       Patient's last menstrual period was 08/08/2023 (exact  date).   The pregnancy intention screening data noted above was reviewed. Potential methods of contraception were discussed. The patient elected to proceed with No data recorded.   Last pap No results found for: "DIAGPAP", "HPVHIGH", "ADEQPAP"  Last mammogram: n/a.  Last colonoscopy: n/a.   Review of Systems:   Pertinent items are noted in HPI Denies any headaches, blurred vision, fatigue, shortness of breath, chest pain, abdominal pain, abnormal vaginal discharge/itching/odor/irritation, problems with periods, bowel movements, urination, or intercourse unless otherwise stated above. Pertinent History Reviewed:  Reviewed past medical,surgical, social and family history.  Reviewed problem list, medications and allergies. Physical Assessment:   Vitals:   08/31/23 1004  BP: 138/87  Pulse: 65  Weight: 130 lb (59 kg)  Height: 5\' 7"  (1.702 m)  Body mass index is 20.36 kg/m.        Physical Examination:   General appearance - well appearing, and in no distress  Mental status - alert, oriented to person, place, and time  Psych:  She has a normal mood and affect  Skin - warm and dry, normal color, no suspicious lesions noted  Chest - effort normal, all lung fields clear to auscultation bilaterally  Heart - normal rate and regular rhythm  Breasts - breasts appear normal, no suspicious masses, no skin or nipple changes or  axillary nodes, mild tenderness (notes typical around her menses)  Abdomen - soft, nontender, nondistended, no masses or organomegaly  Pelvic -  VULVA: normal appearing vulva with no masses, tenderness or lesions   VAGINA: normal appearing vagina with normal color and discharge, no lesions   CERVIX: normal appearing cervix without  discharge or lesions, mild CMT Non-tender levator ani bilaterraly   Thin prep pap is done with HR HPV cotesting  UTERUS: uterus is felt to be normal size, shape, consistency and nontender  ADNEXA: No adnexal masses or tenderness  noted.  Extremities:  No swelling or varicosities noted  Chaperone present for exam  No results found for this or any previous visit (from the past 24 hours).    Assessment & Plan:   Assessment and Plan    Well Woman Exam  - Cervical cancer screening: Discussed screening Q3 years. Reviewed importance of annual exams and limits of pap smear. Pap with reflex HPV collected today  - GC/CT: Discussed and recommended. Pt accepts - Gardasil: given information, not seen on file - Birth Control: Nuvaring - Breast Health: Encouraged self breast awareness/exams.  - Follow-up: 12 months and prn   Endometriosis Painful periods and presence of endometriosis cysts. Discussed the risks/benefits of hormonal birth control (NuvaRing) for symptom management and prevention of disease progression. -Start NuvaRing for endometriosis management. -Order pelvic ultrasound to assess current status of endometriomas -Discussed without suppression there is risk of progression of disease including larger cysts, loss of ovarian function, and general worsening of endometriosis pathology and symptoms.  -Has had prior surgery that confirmed endometriosis with drainage of endometrioma   Sexual Health Sexually active with female partner. No prior Pap smear or STD testing. -Order gonorrhea, chlamydia, trichomonas, HIV, syphilis, and hepatitis testing. -Follow-up in 2-3 months to review ultrasound and assess response to NuvaRing.       Orders Placed This Encounter  Procedures   US PELVIC COMPLETE WITH TRANSVAGINAL   RPR+HBsAg+HCVAb+...    Meds:  Meds ordered this encounter  Medications   etonogestrel-ethinyl estradiol (NUVARING) 0.12-0.015 MG/24HR vaginal ring    Sig: Insert vaginally and leave in place for 4 consecutive weeks, then swtich    Dispense:  1 each    Refill:  11    Follow-up: No follow-ups on file.  Lorriane Shire, MD 08/31/2023 10:09 AM

## 2023-08-31 NOTE — Patient Instructions (Signed)
 Pap Test Why am I having this test? A Pap test, also called a Pap smear, is a screening test to check for signs of: Infection. Cancer of the cervix. The cervix is the lower part of the uterus that opens into the vagina. Changes that may be a sign that cancer is developing (precancerous changes). Women need this test on a regular basis. In general, you should have a Pap test every 3 years until you reach menopause or age 26. Women aged 30-60 may choose to have their Pap test done at the same time as an HPV (human papillomavirus) test every 5 years (instead of every 3 years). Your health care provider may recommend having Pap tests more or less often depending on your medical conditions and past Pap test results. What is being tested? Cervical cells are tested for signs of infection or abnormalities. What kind of sample is taken?  Your health care provider will collect a sample of cells from the surface of your cervix. This will be done using a small cotton swab, plastic spatula, or brush that is inserted into your vagina using a tool called a speculum. This sample is often collected during a pelvic exam, when you are lying on your back on an exam table with your feet in footrests (stirrups). In some cases, fluids (secretions) from the cervix or vagina may also be collected. How do I prepare for this test? Be aware of where you are in your menstrual cycle. If you are menstruating on the day of the test, you may be asked to reschedule. You may need to reschedule if you have a known vaginal infection on the day of the test. Follow instructions from your health care provider about: Changing or stopping your regular medicines. Some medicines can cause abnormal test results, such as vaginal medicines and tetracycline. Avoiding douching 2-3 days before or the day of the test. Tell a health care provider about: Any allergies you have. All medicines you are taking, including vitamins, herbs, eye drops,  creams, and over-the-counter medicines. Any bleeding problems you have. Any surgeries you have had. Any medical conditions you have. Whether you are pregnant or may be pregnant. How are the results reported? Your test results will be reported as either abnormal or normal. What do the results mean? A normal test result means that you do not have signs of cancer of the cervix. An abnormal result may mean that you have: Cancer. A Pap test by itself is not enough to diagnose cancer. You will have more tests done if cancer is suspected. Precancerous changes in your cervix. Inflammation of the cervix. An STI (sexually transmitted infection). A fungal infection. A parasite infection. Talk with your health care provider about what your results mean. In some cases, your health care provider may do more testing to confirm the results. Questions to ask your health care provider Ask your health care provider, or the department that is doing the test: When will my results be ready? How will I get my results? What are my treatment options? What other tests do I need? What are my next steps? Summary In general, women should have a Pap test every 3 years until they reach menopause or age 96. Your health care provider will collect a sample of cells from the surface of your cervix. This will be done using a small cotton swab, plastic spatula, or brush. In some cases, fluids (secretions) from the cervix or vagina may also be collected. This information is not  intended to replace advice given to you by your health care provider. Make sure you discuss any questions you have with your health care provider. Document Revised: 09/21/2020 Document Reviewed: 09/21/2020 Elsevier Patient Education  2024 Elsevier Inc.    Uterine Tissue Growing Outside the Uterus (Endometriosis): What to Know Endometriosis is a condition in which the tissue that forms the lining of the uterus grows in places outside the uterus.  This tissue can grow in: The organs that store the eggs (ovaries). The tubes that carry the eggs to the uterus (fallopian tubes). The vagina. The bowel. This tissue most often grows on the ovaries and on the lining of the pelvic cavity. What are the causes? The cause of this condition is not known. What increases the risk? Having a family history of endometriosis. Having never given birth. Starting your period at age 49 or younger. What are the signs or symptoms? Often, there are no symptoms of this condition. If you do have symptoms, they may include: Heavy bleeding during your period. Periods that happen more than once a month. Not being able to get pregnant. Pain. You may feel pain: Between your hip bones (pelvis). In your back. In your belly. During sex. When you poop or pee during your period. Rarely, you may see blood in your poop or pee. Depending on where the changed tissue is growing, symptoms may: Happen during your period (most often) or at the middle of your cycle. Come and go. You may have no symptoms during some months. Stop when you no longer have your periods (menopause). How is this diagnosed? This condition is diagnosed based on your symptoms and a physical exam. You may also have tests, such as: Blood and pee tests to help rule out other causes. Ultrasound to look for tissues that are not normal. X-ray of the lower bowel. CT scan. MRI. To confirm the diagnosis, your health care provider may: Use a device with a light and camera to check tissue inside your belly (laparoscopy). Take out changed tissue for testing in a lab (biopsy). How is this treated? There's no cure for this condition. The treatment aims to control your symptoms. The type of treatment depends on whether you want to become pregnant in the future. This condition may be treated with: Pain medicines. These include NSAIDs, such as ibuprofen. Hormones, such as birth control pills, to slow the  growth of changed tissue. Surgery to remove the changed tissue. Tissue may be removed using a laparoscope and a laser. The ovaries, fallopian tubes, and uterus may be removed (hysterectomy). This is done in very severe cases. Follow these instructions at home: Take your medicines only as told. Where to find more information [employer]: acog.org Office on [employer] Health: [employer] Contact a health care provider if: You have irregular periods. You have problems getting pregnant. You have pain in your belly on the lower right side. Get help right away if: You have very bad pain that does not get better with medicine. You have a high fever. You have very bad throwing up or a feeling that you may throw up. You have bloating and swelling in your belly. You have pain when you poop, or you notice blood in your poop. This information is not intended to replace advice given to you by your health care provider. Make sure you discuss any questions you have with your health care provider. Document Revised: 12/08/2022 Document Reviewed: 12/08/2022 Elsevier Patient Education  2024 [employer].

## 2023-09-01 ENCOUNTER — Encounter: Payer: Self-pay | Admitting: Obstetrics and Gynecology

## 2023-09-01 LAB — CYTOLOGY - PAP
Adequacy: ABSENT
Chlamydia: NEGATIVE
Comment: NEGATIVE
Comment: NEGATIVE
Comment: NORMAL
Diagnosis: NEGATIVE
Neisseria Gonorrhea: NEGATIVE
Trichomonas: NEGATIVE

## 2023-09-01 LAB — RPR+HBSAG+HCVAB+...
HIV Screen 4th Generation wRfx: NONREACTIVE
Hep C Virus Ab: NONREACTIVE
Hepatitis B Surface Ag: NEGATIVE
RPR Ser Ql: NONREACTIVE

## 2023-09-04 ENCOUNTER — Telehealth (HOSPITAL_BASED_OUTPATIENT_CLINIC_OR_DEPARTMENT_OTHER): Payer: Self-pay

## 2023-09-04 ENCOUNTER — Ambulatory Visit (HOSPITAL_BASED_OUTPATIENT_CLINIC_OR_DEPARTMENT_OTHER): Admission: RE | Admit: 2023-09-04 | Payer: Medicaid Other | Source: Ambulatory Visit

## 2023-09-24 ENCOUNTER — Ambulatory Visit
Admission: RE | Admit: 2023-09-24 | Discharge: 2023-09-24 | Disposition: A | Discharge status: Home / Self Care | Source: Ambulatory Visit | Attending: Family Medicine | Admitting: Family Medicine

## 2023-09-24 VITALS — BP 118/81 | HR 90 | Temp 99.2°F | Resp 16

## 2023-09-24 DIAGNOSIS — J069 Acute upper respiratory infection, unspecified: Secondary | ICD-10-CM | POA: Insufficient documentation

## 2023-09-24 DIAGNOSIS — R070 Pain in throat: Secondary | ICD-10-CM | POA: Diagnosis present

## 2023-09-24 LAB — POC COVID19/FLU A&B COMBO
Covid Antigen, POC: NEGATIVE
Influenza A Antigen, POC: NEGATIVE
Influenza B Antigen, POC: NEGATIVE

## 2023-09-24 LAB — POCT RAPID STREP A (OFFICE): Rapid Strep A Screen: NEGATIVE

## 2023-09-24 MED ORDER — IBUPROFEN 600 MG PO TABS: 600.0000 mg | ORAL_TABLET | Freq: Three times a day (TID) | ORAL | 0 refills | Status: DC | PRN

## 2023-09-24 MED ORDER — BENZONATATE 100 MG PO CAPS: 100.0000 mg | ORAL_CAPSULE | Freq: Three times a day (TID) | ORAL | 0 refills | Status: DC | PRN

## 2023-09-24 NOTE — ED Triage Notes (Signed)
 Pt states fever,body aches,chills and sore throat for the past 3 days.

## 2023-09-24 NOTE — ED Provider Notes (Signed)
 [facility]    CSN: [id number] Arrival date & time: 09/24/23  1034      History   Chief Complaint Chief Complaint  Patient presents with   Fever    Entered by patient    HPI Maty Zeisler is a 28 y.o. female.    Fever Here for sore throat fever that began on the evening of March 17.  She has also had some rhinorrhea and cough and a little diarrhea. No nausea or vomiting.  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 No pertinent past medical history.  Patient Active Problem List   Diagnosis Date Noted   Ovarian cyst 11/07/2021    Past Surgical History:  Procedure Laterality Date   DIAGNOSTIC LARYNGOSCOPY  11/07/2021   Procedure: DIAGNOSTIC LARYNGOSCOPY, lysis of adhesions, peritoneal wall biopsy;  Surgeon: Reva Bores, MD;  Location: [facility] OR;  Service: Gynecology;;    OB History   No obstetric history on file.      Home Medications    Prior to Admission medications   Medication Sig Start Date End Date Taking? Authorizing Provider  benzonatate (TESSALON) 100 MG capsule Take 1 capsule (100 mg total) by mouth 3 (three) times daily as needed for cough. 09/24/23  Yes Zenia Resides, MD  ibuprofen (ADVIL) 600 MG tablet Take 1 tablet (600 mg total) by mouth every 8 (eight) hours as needed (pain). 09/24/23  Yes Zenia Resides, MD  etonogestrel-ethinyl estradiol Lavella Lemons) 0.12-0.015 MG/24HR vaginal ring Insert vaginally and leave in place for 4 consecutive weeks, then swtich 08/31/23   Lorriane Shire, MD    Family History History reviewed. No pertinent family history.  Social History Social History   Tobacco Use   Smoking status: Some Days    Types: Cigarettes, E-cigarettes   Smokeless tobacco: Never  Vaping Use   Vaping status: Every Day  Substance Use Topics   Alcohol use: Yes    Comment: occasional   Drug use: Yes    Types: Marijuana    Comment:  occasional     Allergies   Patient has no known allergies.   Review of Systems Review of Systems  Constitutional:  Positive for fever.     Physical Exam Triage Vital Signs ED Triage Vitals  Encounter Vitals Group     BP 09/24/23 1045 118/81     Systolic BP Percentile --      Diastolic BP Percentile --      Pulse Rate 09/24/23 1045 90     Resp 09/24/23 1045 16     Temp 09/24/23 1045 99.2 F (37.3 C)     Temp Source 09/24/23 1045 Oral     SpO2 09/24/23 1045 97 %     Weight --      Height --      Head Circumference --      Peak Flow --      Pain Score 09/24/23 1046 8     Pain Loc --      Pain Education --      Exclude from Growth Chart --    No data found.  Updated Vital Signs BP 118/81 (BP Location: Right Arm)   Pulse 90   Temp 99.2 F (37.3 C) (Oral)   Resp 16   LMP 09/04/2023 (Exact Date)   SpO2 97%   Visual Acuity Right Eye Distance:   Left Eye Distance:  Bilateral Distance:    Right Eye Near:   Left Eye Near:    Bilateral Near:     Physical Exam Vitals reviewed.  Constitutional:      General: She is not in acute distress.    Appearance: She is not ill-appearing, toxic-appearing or diaphoretic.  HENT:     Right Ear: Tympanic membrane and ear canal normal.     Left Ear: Tympanic membrane and ear canal normal.     Nose: Congestion present.     Mouth/Throat:     Mouth: Mucous membranes are moist.     Comments: There is erythema of the posterior oropharynx with clear mucus draining. Eyes:     Extraocular Movements: Extraocular movements intact.     Conjunctiva/sclera: Conjunctivae normal.     Pupils: Pupils are equal, round, and reactive to light.  Cardiovascular:     Rate and Rhythm: Normal rate and regular rhythm.     Heart sounds: No murmur heard. Pulmonary:     Effort: Pulmonary effort is normal. No respiratory distress.     Breath sounds: No stridor. No wheezing, rhonchi or rales.  Musculoskeletal:     Cervical back: Neck supple.   Lymphadenopathy:     Cervical: No cervical adenopathy.  Skin:    Capillary Refill: Capillary refill takes less than 2 seconds.     Coloration: Skin is not jaundiced or pale.  Neurological:     General: No focal deficit present.     Mental Status: She is alert and oriented to person, place, and time.  Psychiatric:        Behavior: Behavior normal.      UC Treatments / Results  Labs (all labs ordered are listed, but only abnormal results are displayed) Labs Reviewed  POCT RAPID STREP A (OFFICE) - Normal  POC COVID19/FLU A&B COMBO - Normal  CULTURE, GROUP A STREP [facility])    EKG   Radiology No results found.  Procedures Procedures (including critical care time)  Medications Ordered in UC Medications - No data to display  Initial Impression / Assessment and Plan / UC Course  I have reviewed the triage vital signs and the nursing notes.  Pertinent labs & imaging results that were available during my care of the patient were reviewed by me and considered in my medical decision making (see chart for details).     Test for flu and COVID is negative  Rapid strep is negative.  Throat culture is sent and we will notify and treat protocol if that is positive  Ibuprofen and Tessalon Perles are sent in for the symptoms. Final Clinical Impressions(s) / UC Diagnoses   Final diagnoses:  Throat pain  Viral URI     Discharge Instructions      The test for flu and COVID was negative  Your strep test is negative.  Culture of the throat will be sent, and staff will notify you if that is in turn positive.  Take ibuprofen 800 mg--1 tab every 8 hours as needed for pain.  Take benzonatate 100 mg, 1 tab every 8 hours as needed for cough.       ED Prescriptions     Medication Sig Dispense Auth. Provider   ibuprofen (ADVIL) 600 MG tablet Take 1 tablet (600 mg total) by mouth every 8 (eight) hours as needed (pain). 15 tablet Yuval Rubens, Janace Aris, MD   benzonatate (TESSALON)  100 MG capsule Take 1 capsule (100 mg total) by mouth 3 (three) times daily as needed  for cough. 21 capsule Zenia Resides, MD      PDMP not reviewed this encounter.   Zenia Resides, MD 09/24/23 [phone]

## 2023-09-24 NOTE — Discharge Instructions (Signed)
 The test for flu and COVID was negative  Your strep test is negative.  Culture of the throat will be sent, and staff will notify you if that is in turn positive.  Take ibuprofen 800 mg--1 tab every 8 hours as needed for pain.  Take benzonatate 100 mg, 1 tab every 8 hours as needed for cough.

## 2023-09-27 LAB — CULTURE, GROUP A STREP (THRC)

## 2023-10-08 ENCOUNTER — Ambulatory Visit (INDEPENDENT_AMBULATORY_CARE_PROVIDER_SITE_OTHER)

## 2023-10-08 VITALS — BP 118/77 | HR 79 | Ht 67.0 in | Wt 128.0 lb

## 2023-10-08 DIAGNOSIS — N912 Amenorrhea, unspecified: Secondary | ICD-10-CM | POA: Diagnosis not present

## 2023-10-08 DIAGNOSIS — Z3201 Encounter for pregnancy test, result positive: Secondary | ICD-10-CM

## 2023-10-08 LAB — POCT URINE PREGNANCY: Preg Test, Ur: POSITIVE — AB

## 2023-10-08 NOTE — Progress Notes (Signed)
 Caroline Lynch here for a UPT. Pt had a positive upt at home. LMP is 09/04/23.     UPT in office Positive.    Reviewed medications and informed to start a PNV, if not already. Pt to follow up in 4 weeks for New OB visit.   Danna Hefty [employer]

## 2023-10-11 ENCOUNTER — Telehealth

## 2023-10-11 ENCOUNTER — Telehealth: Admitting: Physician Assistant

## 2023-10-11 DIAGNOSIS — R112 Nausea with vomiting, unspecified: Secondary | ICD-10-CM | POA: Diagnosis not present

## 2023-10-11 MED ORDER — PROMETHAZINE HCL 12.5 MG PO TABS: 12.5000 mg | ORAL_TABLET | Freq: Three times a day (TID) | ORAL | 0 refills | Status: DC | PRN

## 2023-10-11 NOTE — Patient Instructions (Signed)
 [name], thank you for joining Roney Jaffe, PA-C for today's virtual visit.  While this provider is not your primary care provider (PCP), if your PCP is located in our provider database this encounter information will be shared with them immediately following your visit.   A [facility] MyChart account gives you access to today's visit and all your visits, tests, and labs performed at [facility] " click here if you don't have a [facility] MyChart account or go to [email or web address]  Consent: (Patient) Lonnie Rosado provided verbal consent for this virtual visit at the beginning of the encounter.  Current Medications:  Current Outpatient Medications:    promethazine (PHENERGAN) 12.5 MG tablet, Take 1 tablet (12.5 mg total) by mouth every 8 (eight) hours as needed for nausea or vomiting., Disp: 20 tablet, Rfl: 0   benzonatate (TESSALON) 100 MG capsule, Take 1 capsule (100 mg total) by mouth 3 (three) times daily as needed for cough., Disp: 21 capsule, Rfl: 0   etonogestrel-ethinyl estradiol (NUVARING) 0.12-0.015 MG/24HR vaginal ring, Insert vaginally and leave in place for 4 consecutive weeks, then swtich, Disp: 1 each, Rfl: 11   ibuprofen (ADVIL) 600 MG tablet, Take 1 tablet (600 mg total) by mouth every 8 (eight) hours as needed (pain)., Disp: 15 tablet, Rfl: 0   Medications ordered in this encounter:  Meds ordered this encounter  Medications   promethazine (PHENERGAN) 12.5 MG tablet    Sig: Take 1 tablet (12.5 mg total) by mouth every 8 (eight) hours as needed for nausea or vomiting.    Dispense:  20 tablet    Refill:  0    Supervising Provider:   Merrilee Jansky [[id number]]     *If you need refills on other medications prior to your next appointment, please contact your pharmacy*  Follow-Up: Call back or seek an in-person evaluation if the symptoms worsen or if the condition fails to improve as anticipated.  [facility] [phone]  Other  Instructions Severe Morning Sickness: What to Know Severe morning sickness is serious and can happen during pregnancy. You may hear it called hyperemesis gravidarum or HG. In severe morning sickness, you may vomit all day or for many days. You may also feel like vomiting for many days. Severe morning sickness can keep you from eating and drinking enough. This may make you lose too much fluid in your body, or become dehydrated. It can also lead to poor nutrition and weight loss. Severe morning sickness usually happens within the first 20 weeks of pregnancy. After that, it may go away for the rest of your pregnancy, but sometimes it doesn't. What are the causes? The cause of severe morning sickness is not known. It may be linked to: Changes in chemicals called hormones. Changes in the digestive system, such as the esophagus, the stomach, and the bowels. Conditions that are passed in families. What are the signs or symptoms? Very bad vomiting or feeling like you may vomit. These do not get better or go away. Loss of body fluids. No desire for food. Weight loss. How is this diagnosed? Severe morning sickness may be diagnosed based on your medical history, your symptoms, and an exam. Tests that may be done include: Blood tests. Urine tests. Checking blood pressure. How is this treated? You may be told to: Follow an eating plan to lessen vomiting. Eat or drink foods or fluids that have ginger, ginger ale, or ginger tea in them. Use acupressure bracelets or hypnosis. Take  medicines. An eating plan and medicines may be used together. If medicines don't help, you may need to get fluids through an IV. This is a small catheter that's put into a vein in your hand or arm. Follow these instructions at home: To help ease your symptoms, listen to your body. Everyone is different. Find what works best for you. Here are some things you can try to help relieve your symptoms: Meals and snacks  Eat 5-6  small meals or snacks instead of 3 large meals a day. Eat slowly. Before getting out of bed, eat a few crackers. Eat a snack with protein before bed. This could be cheese and crackers, or a peanut butter sandwich made with 1 slice of whole-wheat bread and 1 tsp (5 g) of peanut butter. Try to eat starchy foods as these are usually tolerated well. Examples include toast, bread, pasta, rice, and pretzels. Eat at least one serving of protein with your meals and snacks. Proteins include lean meats, poultry, seafood, beans, nuts, nut butters, eggs, cheese, and yogurt. Fluids It's important to have enough fluid in your body. Try to: Drink small amounts of fluids often. Drink fluids 30 minutes before or after a meal. This will help lessen the feeling of a full stomach. Drink 100% fruit juice or an electrolyte drink. An electrolyte drink contains sodium, potassium, and chloride. Drink fluids that are cold, clear, and carbonated or sour. These include lemonade, ginger ale, lemon-lime soda, ice water, and sparkling water. Add  tsp (0.44 g) ground ginger to hot tea, or drink ginger tea. Other tips that can help Try to avoid: Eating foods that trigger your symptoms. These may include spicy foods, coffee, high-fat foods, and acidic foods. Drinking more than 1 cup of fluid at a time. Skipping meals. The feeling that you may vomit can be worse when your stomach is empty. But if you cannot tolerate food, don't force it. Try sucking on ice chips or other frozen liquids and make up for missed meals later. Do not lie down for at least 2 hours after eating. Stay away from things that may make you vomit, such as: Food smells. Smoke. Strong smells. Loud noises. Warm, humid areas. Fast movements. General instructions Take your medicines only as told. Continue to take your prenatal vitamins as told. If you're having trouble taking them, talk with your provider about other choices. Talk with your provider about  taking vitamin B6. Brush your teeth or use a mouth rinse after meals. Keep all follow-up visits. Your provider will check on your health and symptoms, as well as your baby's health. Contact a health care provider if: You have pain in your belly. You have a very bad headache. You can't see properly. You feel weak or dizzy. You can't eat or drink without throwing up, especially if this goes on for a full day. You throw up or feel like you may throw up, and the symptoms do not go away. You're losing weight. Get help right away if: There's blood in your vomit. You are very weak or faint. You have a fever and your symptoms suddenly get worse. You feel like your heart is racing or skipping a beat (palpitations). These symptoms may be an emergency. Get help right away. If you can't reach your provider, go to an urgent care or emergency room, or call 911. Do not wait to see if the symptoms will go away. Do not drive yourself to the hospital. This information is not intended to replace  advice given to you by your health care provider. Make sure you discuss any questions you have with your health care provider. Document Revised: 12/17/2022 Document Reviewed: 12/17/2022 Elsevier Patient Education  2024 Elsevier Inc.   If you have been instructed to have an in-person evaluation today at a local Urgent Care facility, please use the link below. It will take you to a list of all of our available [facility], including address, phone number and hours of operation. Please do not delay care.  [facility]  If you or a family member do not have a primary care provider, use the link below to schedule a visit and establish care. When you choose a [facility] primary care physician or advanced practice provider, you gain a long-term partner in health. Find a Primary Care Provider  Learn more about [facility] in-office and virtual care options: [facility] - Get Care Now

## 2023-10-11 NOTE — Progress Notes (Signed)
 Virtual Visit Consent   Caroline Lynch, you are scheduled for a virtual visit with a [facility] provider today. Just as with appointments in the office, your consent must be obtained to participate. Your consent will be active for this visit and any virtual visit you may have with one of our providers in the next 365 days. If you have a MyChart account, a copy of this consent can be sent to you electronically.  As this is a virtual visit, video technology does not allow for your provider to perform a traditional examination. This may limit your provider's ability to fully assess your condition. If your provider identifies any concerns that need to be evaluated in person or the need to arrange testing (such as labs, EKG, etc.), we will make arrangements to do so. Although advances in technology are sophisticated, we cannot ensure that it will always work on either your end or our end. If the connection with a video visit is poor, the visit may have to be switched to a telephone visit. With either a video or telephone visit, we are not always able to ensure that we have a secure connection.  By engaging in this virtual visit, you consent to the provision of healthcare and authorize for your insurance to be billed (if applicable) for the services provided during this visit. Depending on your insurance coverage, you may receive a charge related to this service.  I need to obtain your verbal consent now. Are you willing to proceed with your visit today? Caroline Lynch has provided verbal consent on 10/11/2023 for a virtual visit (video or telephone). Roney Jaffe, PA-C  Date: 10/11/2023 2:05 PM   Virtual Visit via Video Note   I, Caroline Lynch, connected with  Caroline Lynch  ([id number], [birth year]) on 10/11/23 at  2:00 PM EDT by a video-enabled telemedicine application and verified that I am speaking with the correct person using two identifiers.  Location: Patient: Virtual Visit Location Patient:  Home Provider: Virtual Visit Location Provider: Home Office   I discussed the limitations of evaluation and management by telemedicine and the availability of in person appointments. The patient expressed understanding and agreed to proceed.    History of Present Illness: Caroline Lynch is a 28 y.o. who identifies as a female who was assigned female at birth,  who is five weeks pregnant, reports intense morning sickness. She has been unable to keep anything down despite trying over-the-counter remedies and home remedies. She had a recent OB appointment to confirm the pregnancy, but did not discuss the morning sickness due to time constraints. The patient is making efforts to stay hydrated.   Problems:  Patient Active Problem List   Diagnosis Date Noted   Ovarian cyst 11/07/2021    Allergies: No Known Allergies Medications:  Current Outpatient Medications:    promethazine (PHENERGAN) 12.5 MG tablet, Take 1 tablet (12.5 mg total) by mouth every 8 (eight) hours as needed for nausea or vomiting., Disp: 20 tablet, Rfl: 0   benzonatate (TESSALON) 100 MG capsule, Take 1 capsule (100 mg total) by mouth 3 (three) times daily as needed for cough., Disp: 21 capsule, Rfl: 0   etonogestrel-ethinyl estradiol (NUVARING) 0.12-0.015 MG/24HR vaginal ring, Insert vaginally and leave in place for 4 consecutive weeks, then swtich, Disp: 1 each, Rfl: 11   ibuprofen (ADVIL) 600 MG tablet, Take 1 tablet (600 mg total) by mouth every 8 (eight) hours as needed (pain)., Disp: 15 tablet, Rfl: 0  Observations/Objective: Patient is well-developed,  well-nourished in no acute distress.  Resting comfortably  at home.  Head is normocephalic, atraumatic.  No labored breathing.  Speech is clear and coherent with logical content.  Patient is alert and oriented at baseline.    Assessment and Plan: 1. Nausea and vomiting, unspecified vomiting type (Primary) - promethazine (PHENERGAN) 12.5 MG tablet; Take 1 tablet (12.5 mg  total) by mouth every 8 (eight) hours as needed for nausea or vomiting.  Dispense: 20 tablet; Refill: 0   Severe morning sickness at five weeks gestation, unresponsive to OTC remedies. Treatment options not previously discussed with OBGYN. - Prescribed Phenergan 12.5 mg,   Follow Up Instructions: I discussed the assessment and treatment plan with the patient. The patient was provided an opportunity to ask questions and all were answered. The patient agreed with the plan and demonstrated an understanding of the instructions.  A copy of instructions were sent to the patient via MyChart unless otherwise noted below.     The patient was advised to call back or seek an in-person evaluation if the symptoms worsen or if the condition fails to improve as anticipated.    Caroline Knudsen Mayers, PA-C

## 2023-10-25 ENCOUNTER — Encounter (HOSPITAL_BASED_OUTPATIENT_CLINIC_OR_DEPARTMENT_OTHER): Payer: Self-pay | Admitting: Emergency Medicine

## 2023-10-25 ENCOUNTER — Emergency Department (HOSPITAL_BASED_OUTPATIENT_CLINIC_OR_DEPARTMENT_OTHER)

## 2023-10-25 ENCOUNTER — Other Ambulatory Visit: Payer: Self-pay

## 2023-10-25 ENCOUNTER — Emergency Department (HOSPITAL_BASED_OUTPATIENT_CLINIC_OR_DEPARTMENT_OTHER)
Admission: EM | Admit: 2023-10-25 | Discharge: 2023-10-25 | Disposition: A | Discharge status: Home / Self Care | Attending: Emergency Medicine | Admitting: Emergency Medicine

## 2023-10-25 DIAGNOSIS — R102 Pelvic and perineal pain: Secondary | ICD-10-CM | POA: Diagnosis not present

## 2023-10-25 DIAGNOSIS — N939 Abnormal uterine and vaginal bleeding, unspecified: Secondary | ICD-10-CM | POA: Insufficient documentation

## 2023-10-25 DIAGNOSIS — R1030 Lower abdominal pain, unspecified: Secondary | ICD-10-CM | POA: Diagnosis present

## 2023-10-25 LAB — BASIC METABOLIC PANEL WITH GFR
Anion gap: 9 (ref 5–15)
BUN: 10 mg/dL (ref 6–20)
CO2: 22 mmol/L (ref 22–32)
Calcium: 9 mg/dL (ref 8.9–10.3)
Chloride: 104 mmol/L (ref 98–111)
Creatinine, Ser: 0.74 mg/dL (ref 0.44–1.00)
GFR, Estimated: >60 mL/min (ref >60)
Glucose, Bld: 95 mg/dL (ref 70–99)
Potassium: 3.6 mmol/L (ref 3.5–5.1)
Sodium: 135 mmol/L (ref 135–145)

## 2023-10-25 LAB — CBC WITH DIFFERENTIAL/PLATELET
Abs Immature Granulocytes: 0.03 10*3/uL (ref 0.00–0.07)
Basophils Absolute: 0 10*3/uL (ref 0.0–0.1)
Basophils Relative: 0 %
Eosinophils Absolute: 0.2 10*3/uL (ref 0.0–0.5)
Eosinophils Relative: 3 %
HCT: 32.3 % — ABNORMAL LOW (ref 36.0–46.0)
Hemoglobin: 10.7 g/dL — ABNORMAL LOW (ref 12.0–15.0)
Immature Granulocytes: 0 %
Lymphocytes Relative: 25 %
Lymphs Abs: 1.9 10*3/uL (ref 0.7–4.0)
MCH: 29.4 pg (ref 26.0–34.0)
MCHC: 33.1 g/dL (ref 30.0–36.0)
MCV: 88.7 fL (ref 80.0–100.0)
Monocytes Absolute: 0.6 10*3/uL (ref 0.1–1.0)
Monocytes Relative: 8 %
Neutro Abs: 4.9 10*3/uL (ref 1.7–7.7)
Neutrophils Relative %: 64 %
Platelets: 248 10*3/uL (ref 150–400)
RBC: 3.64 MIL/uL — ABNORMAL LOW (ref 3.87–5.11)
RDW: 14 % (ref 11.5–15.5)
WBC: 7.7 10*3/uL (ref 4.0–10.5)
nRBC: 0 % (ref 0.0–0.2)

## 2023-10-25 LAB — HCG, QUANTITATIVE, PREGNANCY: hCG, Beta Chain, Quant, S: 4706 m[IU]/mL — ABNORMAL HIGH (ref <5)

## 2023-10-25 MED ORDER — OXYCODONE-ACETAMINOPHEN 5-325 MG PO TABS
1.0000 | ORAL_TABLET | Freq: Once | ORAL | Status: AC
  Administered 2023-10-25: 1 via ORAL
  Filled 2023-10-25: qty 1

## 2023-10-25 MED ORDER — ONDANSETRON HCL 4 MG/2ML IJ SOLN
4.0000 mg | Freq: Once | INTRAMUSCULAR | Status: AC
  Administered 2023-10-25: 4 mg via INTRAVENOUS
  Filled 2023-10-25: qty 2

## 2023-10-25 MED ORDER — MORPHINE SULFATE (PF) 4 MG/ML IV SOLN
4.0000 mg | Freq: Once | INTRAVENOUS | Status: AC
  Administered 2023-10-25: 4 mg via INTRAVENOUS
  Filled 2023-10-25: qty 1

## 2023-10-25 NOTE — ED Triage Notes (Signed)
 Pt had surgical abortion on Tues; pelvic cramping and vaginal bleeding since yesterday

## 2023-10-25 NOTE — ED Provider Notes (Signed)
 [facility] Provider Note   CSN: [id number] Arrival date & time: 10/25/23  1315     History  Chief Complaint  Patient presents with   Pelvic Pain    Caroline Lynch is a 28 y.o. female status post surgical abortion on Tuesday presenting with vaginal bleeding and lower abdominal discomfort since then.  Patient had nausea from the pain but no emesis.  Patient denies any fevers.  Patient states she has profuse vaginal bleeding and in the past when she had medical abortion did not have any complications.  This occurred at [facility].  Patient denies any purulent vaginal discharge.  Home Medications Prior to Admission medications   Medication Sig Start Date End Date Taking? Authorizing Provider  benzonatate  (TESSALON ) 100 MG capsule Take 1 capsule (100 mg total) by mouth 3 (three) times daily as needed for cough. 09/24/23   Ann Keto, MD  etonogestrel -ethinyl estradiol  (NUVARING) 0.12-0.015 MG/24HR vaginal ring Insert vaginally and leave in place for 4 consecutive weeks, then swtich 08/31/23   Ajewole, Christana, MD  ibuprofen  (ADVIL ) 600 MG tablet Take 1 tablet (600 mg total) by mouth every 8 (eight) hours as needed (pain). 09/24/23   Ann Keto, MD  promethazine  (PHENERGAN ) 12.5 MG tablet Take 1 tablet (12.5 mg total) by mouth every 8 (eight) hours as needed for nausea or vomiting. 10/11/23   Mayers, Cari S, PA-C      Allergies    Patient has no known allergies.    Review of Systems   Review of Systems  Genitourinary:  Positive for pelvic pain.    Physical Exam Updated Vital Signs BP 113/82   Pulse (!) 58   Temp 98.8 F (37.1 C)   Resp 14   Ht 5\' 7"  (1.702 m)   Wt 57.6 kg   LMP 09/04/2023   SpO2 100%   Breastfeeding No   BMI 19.89 kg/m  Physical Exam Constitutional:      General: She is in acute distress.  Cardiovascular:     Rate and Rhythm: Normal rate and regular rhythm.     Pulses: Normal pulses.      Heart sounds: Normal heart sounds.  Pulmonary:     Effort: Pulmonary effort is normal. No respiratory distress.     Breath sounds: Normal breath sounds.  Abdominal:     Palpations: Abdomen is soft.     Tenderness: There is abdominal tenderness (Lower abdominal). There is no guarding or rebound.  Genitourinary:    Comments: Chaperone: Caroline, NT Blood in the vaginal canal however no products of conception noted No purulent discharge noted Neurological:     Mental Status: She is alert.     ED Results / Procedures / Treatments   Labs (all labs ordered are listed, but only abnormal results are displayed) Labs Reviewed  CBC WITH DIFFERENTIAL/PLATELET - Abnormal; Notable for the following components:      Result Value   RBC 3.64 (*)    Hemoglobin 10.7 (*)    HCT 32.3 (*)    All other components within normal limits  HCG, QUANTITATIVE, PREGNANCY - Abnormal; Notable for the following components:   hCG, Beta Chain, Quant, S 4,706 (*)    All other components within normal limits  BASIC METABOLIC PANEL WITH GFR    EKG None  Radiology No results found.  Procedures Procedures    Medications Ordered in ED Medications  ondansetron  (ZOFRAN ) injection 4 mg (4 mg Intravenous Given 10/25/23 1344)  oxyCODONE -acetaminophen  (PERCOCET/ROXICET) 5-325 MG per tablet 1 tablet (1 tablet Oral Given 10/25/23 1345)  morphine  (PF) 4 MG/ML injection 4 mg (4 mg Intravenous Given 10/25/23 1512)    ED Course/ Medical Decision Making/ A&P                                 Medical Decision Making Amount and/or Complexity of Data Reviewed Labs: ordered. Radiology: ordered.  Risk Prescription drug management.   [name] 27 y.o. presented today for vaginal bleeding. Working DDx that I considered at this time includes, but not limited to, retained products of conception, menstrual cycle, AUB, trauma, PID/TOA, endometrial CA, FB, STI, hemorrhagic cyst, ovarian torsion, miscarriage.  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  Patient does have lower abdominal tenderness without peritoneal signs.  With a chaperone present a pelvic exam was conducted that shows blood in the vaginal canal however no products of conception or purulent discharge noted.  Will get pelvic ultrasound to further assess.  Pain meds given.  Ultrasound ultimately reassuring.  At this time with above exam not showing any products of conception and ultrasound not showing any either do feel that patient's vaginal bleeding is a side effect from the abortion and that she will need to follow-up with OB/GYN closely.  Patient verbalized her agreement with this plan and is okay with discharge at this time.  I do recommend Tylenol  or ibuprofen  every 6 hours as needed for pain to watch her symptoms to which patient verbalized understanding agreement with.  At time of discharge pain is controlled.  Patient was given return precautions. Patient stable for discharge at this time.  Patient verbalized understanding of plan.  This chart was dictated using voice recognition software.  Despite best efforts to proofread,  errors can occur which can change the documentation meaning.        Final Clinical Impression(s) / ED  Diagnoses Final diagnoses:  Pelvic pain in female    Rx / DC Orders ED Discharge Orders     None         Elex Grimmer 10/25/23 1645    Albertus Hughs, DO 10/25/23 1945

## 2023-10-25 NOTE — Discharge Instructions (Signed)
 Please follow-up closely with your OB/GYN in regards to recent ER visit.  Today your labs, imaging, physical have ultimately reassuring and this could be a side effect of the abortion that is normal however you will need to follow-up closely to make sure that complications do not occur.  You may use Tylenol  or ibuprofen  every 6 hours needed for pain.  If symptoms change or worsen please return to the ER.

## 2023-10-26 ENCOUNTER — Emergency Department (HOSPITAL_BASED_OUTPATIENT_CLINIC_OR_DEPARTMENT_OTHER)

## 2023-10-26 ENCOUNTER — Inpatient Hospital Stay (HOSPITAL_BASED_OUTPATIENT_CLINIC_OR_DEPARTMENT_OTHER)
Admission: EM | Admit: 2023-10-26 | Discharge: 2023-10-30 | DRG: 770 | Disposition: A | Discharge status: Home / Self Care | Attending: Obstetrics and Gynecology | Admitting: Obstetrics and Gynecology

## 2023-10-26 ENCOUNTER — Encounter (HOSPITAL_BASED_OUTPATIENT_CLINIC_OR_DEPARTMENT_OTHER): Payer: Self-pay | Admitting: Emergency Medicine

## 2023-10-26 ENCOUNTER — Other Ambulatory Visit: Payer: Self-pay

## 2023-10-26 DIAGNOSIS — F17210 Nicotine dependence, cigarettes, uncomplicated: Secondary | ICD-10-CM | POA: Diagnosis present

## 2023-10-26 DIAGNOSIS — Z3A00 Weeks of gestation of pregnancy not specified: Secondary | ICD-10-CM | POA: Diagnosis not present

## 2023-10-26 DIAGNOSIS — O0387 Sepsis following complete or unspecified spontaneous abortion: Principal | ICD-10-CM | POA: Diagnosis present

## 2023-10-26 DIAGNOSIS — A419 Sepsis, unspecified organism: Secondary | ICD-10-CM

## 2023-10-26 DIAGNOSIS — N179 Acute kidney failure, unspecified: Secondary | ICD-10-CM | POA: Diagnosis not present

## 2023-10-26 DIAGNOSIS — O0482 Renal failure following (induced) termination of pregnancy: Secondary | ICD-10-CM | POA: Diagnosis present

## 2023-10-26 DIAGNOSIS — N80123 Deep endometriosis of bilateral ovaries: Secondary | ICD-10-CM | POA: Diagnosis present

## 2023-10-26 HISTORY — DX: Unspecified ovarian cyst, unspecified side: N83.209

## 2023-10-26 HISTORY — DX: Endometriosis, unspecified: N80.9

## 2023-10-26 LAB — CBC WITH DIFFERENTIAL/PLATELET
Abs Immature Granulocytes: 0.04 10*3/uL (ref 0.00–0.07)
Basophils Absolute: 0 10*3/uL (ref 0.0–0.1)
Basophils Relative: 0 %
Eosinophils Absolute: 0.1 10*3/uL (ref 0.0–0.5)
Eosinophils Relative: 1 %
HCT: 33.3 % — ABNORMAL LOW (ref 36.0–46.0)
Hemoglobin: 11.1 g/dL — ABNORMAL LOW (ref 12.0–15.0)
Immature Granulocytes: 0 %
Lymphocytes Relative: 9 %
Lymphs Abs: 1 10*3/uL (ref 0.7–4.0)
MCH: 29.6 pg (ref 26.0–34.0)
MCHC: 33.3 g/dL (ref 30.0–36.0)
MCV: 88.8 fL (ref 80.0–100.0)
Monocytes Absolute: 0.8 10*3/uL (ref 0.1–1.0)
Monocytes Relative: 8 %
Neutro Abs: 8.9 10*3/uL — ABNORMAL HIGH (ref 1.7–7.7)
Neutrophils Relative %: 82 %
Platelets: 250 10*3/uL (ref 150–400)
RBC: 3.75 MIL/uL — ABNORMAL LOW (ref 3.87–5.11)
RDW: 14 % (ref 11.5–15.5)
WBC: 10.9 10*3/uL — ABNORMAL HIGH (ref 4.0–10.5)
nRBC: 0 % (ref 0.0–0.2)

## 2023-10-26 LAB — COMPREHENSIVE METABOLIC PANEL WITH GFR
ALT: 10 U/L (ref 0–44)
AST: 16 U/L (ref 15–41)
Albumin: 3.9 g/dL (ref 3.5–5.0)
Alkaline Phosphatase: 41 U/L (ref 38–126)
Anion gap: 10 (ref 5–15)
BUN: 11 mg/dL (ref 6–20)
CO2: 21 mmol/L — ABNORMAL LOW (ref 22–32)
Calcium: 9.2 mg/dL (ref 8.9–10.3)
Chloride: 104 mmol/L (ref 98–111)
Creatinine, Ser: 0.74 mg/dL (ref 0.44–1.00)
GFR, Estimated: >60 mL/min (ref >60)
Glucose, Bld: 128 mg/dL — ABNORMAL HIGH (ref 70–99)
Potassium: 3.3 mmol/L — ABNORMAL LOW (ref 3.5–5.1)
Sodium: 135 mmol/L (ref 135–145)
Total Bilirubin: 0.6 mg/dL (ref 0.0–1.2)
Total Protein: 7.7 g/dL (ref 6.5–8.1)

## 2023-10-26 LAB — URINALYSIS, W/ REFLEX TO CULTURE (INFECTION SUSPECTED)
Bilirubin Urine: NEGATIVE
Glucose, UA: NEGATIVE mg/dL
Ketones, ur: NEGATIVE mg/dL
Leukocytes,Ua: NEGATIVE
Nitrite: NEGATIVE
Protein, ur: 100 mg/dL — AB
Specific Gravity, Urine: >=1.03 (ref 1.005–1.030)
pH: 6.5 (ref 5.0–8.0)

## 2023-10-26 LAB — PREGNANCY, URINE: Preg Test, Ur: POSITIVE — AB

## 2023-10-26 LAB — LACTIC ACID, PLASMA: Lactic Acid, Venous: 0.9 mmol/L (ref 0.5–1.9)

## 2023-10-26 MED ORDER — PRENATAL MULTIVITAMIN CH
1.0000 | ORAL_TABLET | Freq: Every day | ORAL | Status: DC
  Administered 2023-10-27 – 2023-10-29 (×2): 1 via ORAL
  Filled 2023-10-26 (×2): qty 1

## 2023-10-26 MED ORDER — ONDANSETRON HCL 4 MG PO TABS
4.0000 mg | ORAL_TABLET | Freq: Four times a day (QID) | ORAL | Status: DC | PRN
  Administered 2023-10-28: 4 mg via ORAL
  Filled 2023-10-26: qty 1

## 2023-10-26 MED ORDER — PIPERACILLIN-TAZOBACTAM 3.375 G IVPB
3.3750 g | Freq: Three times a day (TID) | INTRAVENOUS | Status: DC
  Administered 2023-10-26 – 2023-10-28 (×6): 3.375 g via INTRAVENOUS
  Filled 2023-10-26 (×6): qty 50

## 2023-10-26 MED ORDER — KETOROLAC TROMETHAMINE 30 MG/ML IJ SOLN
30.0000 mg | Freq: Four times a day (QID) | INTRAMUSCULAR | Status: DC
  Administered 2023-10-26 – 2023-10-27 (×3): 30 mg via INTRAVENOUS
  Filled 2023-10-26 (×3): qty 1

## 2023-10-26 MED ORDER — HYDROMORPHONE HCL 1 MG/ML IJ SOLN
0.5000 mg | Freq: Once | INTRAMUSCULAR | Status: AC
  Administered 2023-10-26: 0.5 mg via INTRAVENOUS
  Filled 2023-10-26: qty 1

## 2023-10-26 MED ORDER — ACETAMINOPHEN 325 MG PO TABS: 650.0000 mg | ORAL_TABLET | ORAL | Status: DC | PRN

## 2023-10-26 MED ORDER — OXYCODONE HCL 5 MG PO TABS
5.0000 mg | ORAL_TABLET | ORAL | Status: DC | PRN
  Administered 2023-10-26 – 2023-10-28 (×4): 5 mg via ORAL
  Filled 2023-10-26 (×4): qty 1

## 2023-10-26 MED ORDER — ACETAMINOPHEN 500 MG PO TABS
1000.0000 mg | ORAL_TABLET | Freq: Four times a day (QID) | ORAL | Status: DC
  Administered 2023-10-26 – 2023-10-28 (×7): 1000 mg via ORAL
  Filled 2023-10-26 (×7): qty 2

## 2023-10-26 MED ORDER — IOHEXOL 300 MG/ML  SOLN
100.0000 mL | Freq: Once | INTRAMUSCULAR | Status: AC | PRN
  Administered 2023-10-26: 100 mL via INTRAVENOUS

## 2023-10-26 MED ORDER — ONDANSETRON HCL 4 MG/2ML IJ SOLN
4.0000 mg | Freq: Four times a day (QID) | INTRAMUSCULAR | Status: DC | PRN
  Administered 2023-10-27: 4 mg via INTRAVENOUS
  Filled 2023-10-26: qty 2

## 2023-10-26 MED ORDER — OXYCODONE-ACETAMINOPHEN 5-325 MG PO TABS: 1.0000 | ORAL_TABLET | ORAL | Status: DC | PRN

## 2023-10-26 MED ORDER — KETOROLAC TROMETHAMINE 15 MG/ML IJ SOLN
15.0000 mg | Freq: Once | INTRAMUSCULAR | Status: AC
Start: 2023-10-26 — End: 2023-10-26
  Administered 2023-10-26: 15 mg via INTRAVENOUS
  Filled 2023-10-26: qty 1

## 2023-10-26 MED ORDER — LACTATED RINGERS IV BOLUS (SEPSIS)
1000.0000 mL | Freq: Once | INTRAVENOUS | Status: AC
  Administered 2023-10-26: 1000 mL via INTRAVENOUS

## 2023-10-26 MED ORDER — POTASSIUM CHLORIDE CRYS ER 20 MEQ PO TBCR
40.0000 meq | EXTENDED_RELEASE_TABLET | Freq: Once | ORAL | Status: AC
  Administered 2023-10-26: 40 meq via ORAL
  Filled 2023-10-26: qty 2

## 2023-10-26 MED ORDER — SIMETHICONE 80 MG PO CHEW: 80.0000 mg | CHEWABLE_TABLET | Freq: Four times a day (QID) | ORAL | Status: DC | PRN

## 2023-10-26 MED ORDER — MORPHINE SULFATE (PF) 2 MG/ML IV SOLN
1.0000 mg | INTRAVENOUS | Status: DC | PRN
  Administered 2023-10-26: 2 mg via INTRAVENOUS
  Filled 2023-10-26: qty 1

## 2023-10-26 MED ORDER — DOCUSATE SODIUM 100 MG PO CAPS
100.0000 mg | ORAL_CAPSULE | Freq: Two times a day (BID) | ORAL | Status: DC
  Administered 2023-10-26 – 2023-10-28 (×5): 100 mg via ORAL
  Filled 2023-10-26 (×5): qty 1

## 2023-10-26 MED ORDER — SODIUM CHLORIDE 0.9 % IV SOLN: INTRAVENOUS | Status: AC

## 2023-10-26 MED ORDER — KETOROLAC TROMETHAMINE 30 MG/ML IJ SOLN: 30.0000 mg | Freq: Four times a day (QID) | INTRAMUSCULAR | Status: DC

## 2023-10-26 MED ORDER — PIPERACILLIN-TAZOBACTAM 3.375 G IVPB 30 MIN
3.3750 g | INTRAVENOUS | Status: AC
  Administered 2023-10-26: 3.375 g via INTRAVENOUS
  Filled 2023-10-26: qty 50

## 2023-10-26 MED ORDER — VANCOMYCIN HCL IN DEXTROSE 1-5 GM/200ML-% IV SOLN
1000.0000 mg | Freq: Two times a day (BID) | INTRAVENOUS | Status: DC
  Administered 2023-10-26: 1000 mg via INTRAVENOUS
  Filled 2023-10-26 (×2): qty 200

## 2023-10-26 MED ORDER — VANCOMYCIN HCL IN DEXTROSE 1-5 GM/200ML-% IV SOLN
1000.0000 mg | INTRAVENOUS | Status: AC
  Administered 2023-10-26: 1000 mg via INTRAVENOUS
  Filled 2023-10-26: qty 200

## 2023-10-26 NOTE — Sepsis Progress Note (Signed)
 Elink monitoring for the code sepsis protocol.

## 2023-10-26 NOTE — Sepsis Progress Note (Signed)
 Asking bedside RN if blood  cultures drawn before antibiotics hung, she replied yes

## 2023-10-26 NOTE — H&P (Signed)
 [facility] Attending History and Physical  Caroline Lynch is a 28 y.o. who presented to the [facility] today for evaluation of fever, pelvic pain and vaginal bleeding.   She reports a first trimester surgical abortion 6 days ago (Tuesday 4/15). It was reportedly uncomplicated. She described pain and bleeding as mild/minimal at first. Then 2 days ago, started to develop pelvic pain and heavier bleeding. She reports this morning, she developed a fever to 100.8.  She presented to the ER yesterday for the pain and bleeding. She had an ultrasound and was told that everything was normal.  She returned today due to fever and continued pain and bleeding. She was febrile to 101.7 with mild tachycardia of 110. She received zosyn  in the ER as well as IV fluids.  She denies any other symptoms that could explain her fever including cough, congestion, sick contacts. Denies diarrhea, constipation.     Past Medical History:  Diagnosis Date   Endometriosis    Ovarian cyst    Past Surgical History:  Procedure Laterality Date   DIAGNOSTIC LARYNGOSCOPY  11/07/2021   Procedure: DIAGNOSTIC LARYNGOSCOPY, lysis of adhesions, peritoneal wall biopsy;  Surgeon: Granville Layer, MD;  Location: [facility] OR;  Service: Gynecology;;   THERAPEUTIC ABORTION     OB History  No obstetric history on file.  Patient denies any other pertinent gynecologic issues.  No current facility-administered medications on file prior to encounter.   Current Outpatient Medications on File Prior to Encounter  Medication Sig Dispense Refill   benzonatate  (TESSALON ) 100 MG capsule Take 1 capsule (100 mg total) by mouth 3 (three) times daily as needed for cough. 21 capsule 0   etonogestrel -ethinyl estradiol  (NUVARING) 0.12-0.015 MG/24HR vaginal ring Insert vaginally and leave in place for 4 consecutive weeks, then swtich 1 each 11   ibuprofen  (ADVIL ) 600 MG tablet Take 1 tablet (600 mg total) by mouth  every 8 (eight) hours as needed (pain). 15 tablet 0   promethazine  (PHENERGAN ) 12.5 MG tablet Take 1 tablet (12.5 mg total) by mouth every 8 (eight) hours as needed for nausea or vomiting. 20 tablet 0   No Known Allergies  Social History:   reports that she has been smoking cigarettes and e-cigarettes. She has never used smokeless tobacco. She reports current alcohol use. She reports current drug use. Drug: Marijuana. History reviewed. No pertinent family history.  Review of Systems: Pertinent items noted in HPI and remainder of comprehensive ROS otherwise negative.  PHYSICAL EXAM: Blood pressure 128/75, pulse 88, temperature 98.1 F (36.7 C), temperature source Oral, resp. rate 16, height 5\' 7"  (1.702 m), weight 57.6 kg, last menstrual period 09/04/2023, SpO2 100%. CONSTITUTIONAL: Well-developed, well-nourished female in no acute distress.  HENT:  Normocephalic, atraumatic, External right and left ear normal. Oropharynx is clear and moist EYES: Conjunctivae and EOM are normal. Pupils are equal, round, and reactive to light. No scleral icterus.  NECK: Normal range of motion, supple, no masses SKIN: Skin is warm and dry. No rash noted. Not diaphoretic. No erythema. No pallor. NEUROLOGIC: Alert and oriented to person, place, and time. Normal reflexes, muscle tone coordination. No cranial nerve deficit noted. PSYCHIATRIC: Normal mood and affect. Normal behavior. Normal judgment and thought content. CARDIOVASCULAR: Normal heart rate noted, regular rhythm RESPIRATORY: Effort and breath sounds normal, no problems with respiration noted ABDOMEN: Soft, moderately tender, nondistended, no rebound/guarding PELVIC: deferred at this time MUSCULOSKELETAL: Normal range of motion. No tenderness.  No cyanosis, clubbing, or edema.  2+ distal pulses.  Labs: Results for orders placed or performed during the hospital encounter of 10/26/23 (from the past 2 weeks)  Lactic acid, plasma   Collection Time:  10/26/23  6:44 AM  Result Value Ref Range   Lactic Acid, Venous 0.9 0.5 - 1.9 mmol/L  Comprehensive metabolic panel   Collection Time: 10/26/23  6:44 AM  Result Value Ref Range   Sodium 135 135 - 145 mmol/L   Potassium 3.3 (L) 3.5 - 5.1 mmol/L   Chloride 104 98 - 111 mmol/L   CO2 21 (L) 22 - 32 mmol/L   Glucose, Bld 128 (H) 70 - 99 mg/dL   BUN 11 6 - 20 mg/dL   Creatinine, Ser 1.47 0.44 - 1.00 mg/dL   Calcium 9.2 8.9 - 82.9 mg/dL   Total Protein 7.7 6.5 - 8.1 g/dL   Albumin 3.9 3.5 - 5.0 g/dL   AST 16 15 - 41 U/L   ALT 10 0 - 44 U/L   Alkaline Phosphatase 41 38 - 126 U/L   Total Bilirubin 0.6 0.0 - 1.2 mg/dL   GFR, Estimated >56 >21 mL/min   Anion gap 10 5 - 15  CBC with Differential   Collection Time: 10/26/23  6:44 AM  Result Value Ref Range   WBC 10.9 (H) 4.0 - 10.5 K/uL   RBC 3.75 (L) 3.87 - 5.11 MIL/uL   Hemoglobin 11.1 (L) 12.0 - 15.0 g/dL   HCT 30.8 (L) 65.7 - 84.6 %   MCV 88.8 80.0 - 100.0 fL   MCH 29.6 26.0 - 34.0 pg   MCHC 33.3 30.0 - 36.0 g/dL   RDW 96.2 95.2 - 84.1 %   Platelets 250 150 - 400 K/uL   nRBC 0.0 0.0 - 0.2 %   Neutrophils Relative % 82 %   Neutro Abs 8.9 (H) 1.7 - 7.7 K/uL   Lymphocytes Relative 9 %   Lymphs Abs 1.0 0.7 - 4.0 K/uL   Monocytes Relative 8 %   Monocytes Absolute 0.8 0.1 - 1.0 K/uL   Eosinophils Relative 1 %   Eosinophils Absolute 0.1 0.0 - 0.5 K/uL   Basophils Relative 0 %   Basophils Absolute 0.0 0.0 - 0.1 K/uL   Immature Granulocytes 0 %   Abs Immature Granulocytes 0.04 0.00 - 0.07 K/uL  Urinalysis, w/ Reflex to Culture (Infection Suspected) -Urine, Clean Catch   Collection Time: 10/26/23  6:44 AM  Result Value Ref Range   Specimen Source URINE, CLEAN CATCH    Color, Urine YELLOW YELLOW   APPearance HAZY (A) CLEAR   Specific Gravity, Urine >=1.030 1.005 - 1.030   pH 6.5 5.0 - 8.0   Glucose, UA NEGATIVE NEGATIVE mg/dL   Hgb urine dipstick MODERATE (A) NEGATIVE   Bilirubin Urine NEGATIVE NEGATIVE   Ketones, ur NEGATIVE  NEGATIVE mg/dL   Protein, ur 324 (A) NEGATIVE mg/dL   Nitrite NEGATIVE NEGATIVE   Leukocytes,Ua NEGATIVE NEGATIVE   Squamous Epithelial / HPF 0-5 0 - 5 /HPF   WBC, UA 0-5 0 - 5 WBC/hpf   RBC / HPF 6-10 0 - 5 RBC/hpf   Bacteria, UA RARE (A) NONE SEEN  Pregnancy, urine   Collection Time: 10/26/23  6:44 AM  Result Value Ref Range   Preg Test, Ur POSITIVE (A) NEGATIVE  Results for orders placed or performed during the hospital encounter of 10/25/23 (from the past 2 weeks)  Basic metabolic panel   Collection Time: 10/25/23  1:38 PM  Result Value Ref Range  Sodium 135 135 - 145 mmol/L   Potassium 3.6 3.5 - 5.1 mmol/L   Chloride 104 98 - 111 mmol/L   CO2 22 22 - 32 mmol/L   Glucose, Bld 95 70 - 99 mg/dL   BUN 10 6 - 20 mg/dL   Creatinine, Ser 0.98 0.44 - 1.00 mg/dL   Calcium 9.0 8.9 - 11.9 mg/dL   GFR, Estimated >14 >78 mL/min   Anion gap 9 5 - 15  CBC with Differential   Collection Time: 10/25/23  1:38 PM  Result Value Ref Range   WBC 7.7 4.0 - 10.5 K/uL   RBC 3.64 (L) 3.87 - 5.11 MIL/uL   Hemoglobin 10.7 (L) 12.0 - 15.0 g/dL   HCT 29.5 (L) 62.1 - 30.8 %   MCV 88.7 80.0 - 100.0 fL   MCH 29.4 26.0 - 34.0 pg   MCHC 33.1 30.0 - 36.0 g/dL   RDW 65.7 84.6 - 96.2 %   Platelets 248 150 - 400 K/uL   nRBC 0.0 0.0 - 0.2 %   Neutrophils Relative % 64 %   Neutro Abs 4.9 1.7 - 7.7 K/uL   Lymphocytes Relative 25 %   Lymphs Abs 1.9 0.7 - 4.0 K/uL   Monocytes Relative 8 %   Monocytes Absolute 0.6 0.1 - 1.0 K/uL   Eosinophils Relative 3 %   Eosinophils Absolute 0.2 0.0 - 0.5 K/uL   Basophils Relative 0 %   Basophils Absolute 0.0 0.0 - 0.1 K/uL   Immature Granulocytes 0 %   Abs Immature Granulocytes 0.03 0.00 - 0.07 K/uL  hCG, quantitative, pregnancy   Collection Time: 10/25/23  1:38 PM  Result Value Ref Range   hCG, Beta Chain, Quant, S 4,706 (H) <5 mIU/mL    Imaging Studies: CT ABDOMEN PELVIS W CONTRAST Result Date: 10/26/2023 CLINICAL DATA:  Status post surgical abortion on  04/15. Abdominal pain and bleeding since. Prior clinical history includes endometriosis. EXAM: CT ABDOMEN AND PELVIS WITH CONTRAST TECHNIQUE: Multidetector CT imaging of the abdomen and pelvis was performed using the standard protocol following bolus administration of intravenous contrast. RADIATION DOSE REDUCTION: This exam was performed according to the departmental dose-optimization program which includes automated exposure control, adjustment of the mA and/or kV according to patient size and/or use of iterative reconstruction technique. CONTRAST:  OMNIPAQUE  IOHEXOL  300 MG/ML  SOLN COMPARISON:  Pelvic ultrasound 10/25/2023. Abdominopelvic CT 11/07/2021 FINDINGS: Lower chest: Clear lung bases. Normal heart size without pericardial or pleural effusion. Hepatobiliary: Normal liver. Normal gallbladder, without biliary ductal dilatation. Pancreas: Normal, without mass or ductal dilatation. Spleen: Normal in size, without focal abnormality. Adrenals/Urinary Tract: Normal adrenal glands. Normal kidneys, without hydronephrosis. Normal urinary bladder. Stomach/Bowel: Normal stomach, without wall thickening. Colonic stool burden suggests constipation. Normal terminal ileum and appendix. Normal small bowel. Vascular/Lymphatic: Normal caliber of the aorta and branch vessels. No abdominopelvic adenopathy. Reproductive: Normal uterus for age. Bilateral complex cystic adnexal masses were detailed as probable endometriomas on prior ultrasound. Example 4.1 x 4.2 cm on the right and 3.0 x 3.3 cm on the left. Other: No significant free fluid.  No free intraperitoneal air. Musculoskeletal: Disc bulge at L4-5 and L5-S1. IMPRESSION: 1. Bilateral adnexal cystic masses, as on ultrasound. These were favored to represent endometriomas on that exam. 2.  Possible constipation. 3. No other explanation for patient's symptoms. Electronically Signed   By: Lore Rode M.D.   On: 10/26/2023 10:47   US  PELVIC COMPLETE W TRANSVAGINAL AND  TORSION R/O Result Date: 10/25/2023 CLINICAL  DATA:  Recent abortion, pelvic pain, history of endometriosis. EXAM: ULTRASOUND OF PELVIS TECHNIQUE: Transabdominal and transvaginalultrasound examination of the pelvis was performed including evaluation of the uterus, ovaries, adnexal regions, and pelvic cul-de-sac. COMPARISON:  None Available. FINDINGS: Uterusanteverted, 9 x 5 x 5 cm. The endometrium 0.8 cm. The uterine cavity is empty. Fundal intramural fibroid measures 1.2 cm. Posterior subserosal fibroid measures 1.5 cm. Right ovary Complex cystic lesion consistent with an endometrioma measures 4.9 x 4.6 x 4.5 cm. Ovary measurements 5.6 x 4.8 x 4.6 cm. Left ovary Heterogeneous complex cystic lesion also likely to be endometrioma measures 3.2 x 2.5 x 2.5 cm. Ovary measurements 5.0 x 4.1 x 3.6 cm. Images of the adnexae demonstrated no masses or fluid collections. Color Doppler demonstrated ovarian blood flow. IMPRESSION: 1. Uterine fibroids. 2. Bilateral ovarian complex cystic lesions consistent with endometriomas. Electronically Signed   By: Sydell Eva M.D.   On: 10/25/2023 16:17    Assessment/Plan: Septic abortion: findings concerning for sepsis related to recent surgical abortion. Her previous ultrasound yesterday was reassuring. Her CT today demonstrated no significant findings. Given that fever evolved today and she has had worsening pain, recommend that we repeat her ultrasound, which was performed, report is currently pending. Currently afebrile and hemodynamically stable -- continue broad spectrum antibiotics: vanc/zosyn  -- IVF -- daily labs -- pain med prn -- f/u ultrasound report -- NPO for now -- discussed possible need for dilation and curettage if evidence of retained tissue on ultrasound report, however most recent report yesterday was reassuring  2. FEN/GI:   -- NPO  -- NS @ 125 cc/hr  3. Pain:   -- scheduled tylenol /toradol   -- oxycodone  and morphine  prn  4. Dispo: inpatient  management   Marci Setter, MD, FACOG Obstetrician & Gynecologist, [facility], [facility]

## 2023-10-26 NOTE — ED Provider Notes (Signed)
 [place] [facility] AT [facility] Provider Note   CSN: [id number] Arrival date & time: 10/26/23  4098     History  Chief Complaint  Patient presents with   Follow-up    Caroline Lynch is a 28 y.o. female.  28 year old female with surgical abortion on 10/20/23 who presents emergency department with abdominal pain and fever.  Patient reports that she went to [facility] and had a surgical abortion when she was approximately [redacted] weeks pregnant.  Afterwards has been experiencing some abdominal pain and bleeding.  Says that this morning she woke up and had a fever as well.  Was in the emergency department yesterday and had a transvaginal ultrasound that did not show acute findings.  Took Tylenol  just prior to arrival.  Denies runny nose, sore throat, cough, nausea, vomiting, diarrhea, or dysuria.       Home Medications Prior to Admission medications   Medication Sig Start Date End Date Taking? Authorizing Provider  benzonatate  (TESSALON ) 100 MG capsule Take 1 capsule (100 mg total) by mouth 3 (three) times daily as needed for cough. 09/24/23   Ann Keto, MD  etonogestrel -ethinyl estradiol  (NUVARING) 0.12-0.015 MG/24HR vaginal ring Insert vaginally and leave in place for 4 consecutive weeks, then swtich 08/31/23   Ajewole, Christana, MD  ibuprofen  (ADVIL ) 600 MG tablet Take 1 tablet (600 mg total) by mouth every 8 (eight) hours as needed (pain). 09/24/23   Ann Keto, MD  promethazine  (PHENERGAN ) 12.5 MG tablet Take 1 tablet (12.5 mg total) by mouth every 8 (eight) hours as needed for nausea or vomiting. 10/11/23   Mayers, Cari S, PA-C      Allergies    Patient has no known allergies.    Review of Systems   Review of Systems  Physical Exam Updated Vital Signs BP 110/75   Pulse 68   Temp (!) 101.7 F (38.7 C) (Oral)   Resp 18   Ht 5\' 7"  (1.702 m)   Wt 57.6 kg   LMP 09/04/2023   SpO2 99%   BMI 19.89 kg/m  Physical Exam Vitals and  nursing note reviewed.  Constitutional:      General: She is not in acute distress.    Appearance: She is well-developed.  HENT:     Head: Normocephalic and atraumatic.     Right Ear: External ear normal.     Left Ear: External ear normal.     Nose: Nose normal.  Eyes:     Extraocular Movements: Extraocular movements intact.     Conjunctiva/sclera: Conjunctivae normal.     Pupils: Pupils are equal, round, and reactive to light.  Cardiovascular:     Rate and Rhythm: Regular rhythm. Tachycardia present.     Heart sounds: No murmur heard. Pulmonary:     Effort: Pulmonary effort is normal. No respiratory distress.  Abdominal:     General: Abdomen is flat. There is no distension.     Palpations: Abdomen is soft. There is no mass.     Tenderness: There is abdominal tenderness (Diffuse, lower). There is no guarding.  Musculoskeletal:     Cervical back: Normal range of motion and neck supple.     Right lower leg: No edema.     Left lower leg: No edema.  Skin:    General: Skin is warm and dry.  Neurological:     Mental Status: She is alert and oriented to person, place, and time. Mental status is at baseline.  Psychiatric:  Mood and Affect: Mood normal.     ED Results / Procedures / Treatments   Labs (all labs ordered are listed, but only abnormal results are displayed) Labs Reviewed  COMPREHENSIVE METABOLIC PANEL WITH GFR - Abnormal; Notable for the following components:      Result Value   Potassium 3.3 (*)    CO2 21 (*)    Glucose, Bld 128 (*)    All other components within normal limits  CBC WITH DIFFERENTIAL/PLATELET - Abnormal; Notable for the following components:   WBC 10.9 (*)    RBC 3.75 (*)    Hemoglobin 11.1 (*)    HCT 33.3 (*)    Neutro Abs 8.9 (*)    All other components within normal limits  URINALYSIS, W/ REFLEX TO CULTURE (INFECTION SUSPECTED) - Abnormal; Notable for the following components:   APPearance HAZY (*)    Hgb urine dipstick MODERATE (*)     Protein, ur 100 (*)    Bacteria, UA RARE (*)    All other components within normal limits  PREGNANCY, URINE - Abnormal; Notable for the following components:   Preg Test, Ur POSITIVE (*)    All other components within normal limits  CULTURE, BLOOD (ROUTINE X 2)  CULTURE, BLOOD (ROUTINE X 2)  LACTIC ACID, PLASMA    EKG None  Radiology CT ABDOMEN PELVIS W CONTRAST Result Date: 10/26/2023 CLINICAL DATA:  Status post surgical abortion on 04/15. Abdominal pain and bleeding since. Prior clinical history includes endometriosis. EXAM: CT ABDOMEN AND PELVIS WITH CONTRAST TECHNIQUE: Multidetector CT imaging of the abdomen and pelvis was performed using the standard protocol following bolus administration of intravenous contrast. RADIATION DOSE REDUCTION: This exam was performed according to the departmental dose-optimization program which includes automated exposure control, adjustment of the mA and/or kV according to patient size and/or use of iterative reconstruction technique. CONTRAST:  OMNIPAQUE  IOHEXOL  300 MG/ML  SOLN COMPARISON:  Pelvic ultrasound 10/25/2023. Abdominopelvic CT 11/07/2021 FINDINGS: Lower chest: Clear lung bases. Normal heart size without pericardial or pleural effusion. Hepatobiliary: Normal liver. Normal gallbladder, without biliary ductal dilatation. Pancreas: Normal, without mass or ductal dilatation. Spleen: Normal in size, without focal abnormality. Adrenals/Urinary Tract: Normal adrenal glands. Normal kidneys, without hydronephrosis. Normal urinary bladder. Stomach/Bowel: Normal stomach, without wall thickening. Colonic stool burden suggests constipation. Normal terminal ileum and appendix. Normal small bowel. Vascular/Lymphatic: Normal caliber of the aorta and branch vessels. No abdominopelvic adenopathy. Reproductive: Normal uterus for age. Bilateral complex cystic adnexal masses were detailed as probable endometriomas on prior ultrasound. Example 4.1 x 4.2 cm on the  right and 3.0 x 3.3 cm on the left. Other: No significant free fluid.  No free intraperitoneal air. Musculoskeletal: Disc bulge at L4-5 and L5-S1. IMPRESSION: 1. Bilateral adnexal cystic masses, as on ultrasound. These were favored to represent endometriomas on that exam. 2.  Possible constipation. 3. No other explanation for patient's symptoms. Electronically Signed   By: Lore Rode M.D.   On: 10/26/2023 10:47   US  PELVIC COMPLETE W TRANSVAGINAL AND TORSION R/O Result Date: 10/25/2023 CLINICAL DATA:  Recent abortion, pelvic pain, history of endometriosis. EXAM: ULTRASOUND OF PELVIS TECHNIQUE: Transabdominal and transvaginalultrasound examination of the pelvis was performed including evaluation of the uterus, ovaries, adnexal regions, and pelvic cul-de-sac. COMPARISON:  None Available. FINDINGS: Uterusanteverted, 9 x 5 x 5 cm. The endometrium 0.8 cm. The uterine cavity is empty. Fundal intramural fibroid measures 1.2 cm. Posterior subserosal fibroid measures 1.5 cm. Right ovary Complex cystic lesion consistent with an endometrioma  measures 4.9 x 4.6 x 4.5 cm. Ovary measurements 5.6 x 4.8 x 4.6 cm. Left ovary Heterogeneous complex cystic lesion also likely to be endometrioma measures 3.2 x 2.5 x 2.5 cm. Ovary measurements 5.0 x 4.1 x 3.6 cm. Images of the adnexae demonstrated no masses or fluid collections. Color Doppler demonstrated ovarian blood flow. IMPRESSION: 1. Uterine fibroids. 2. Bilateral ovarian complex cystic lesions consistent with endometriomas. Electronically Signed   By: Sydell Eva M.D.   On: 10/25/2023 16:17    Procedures Procedures    Medications Ordered in ED Medications  vancomycin  (VANCOCIN ) IVPB 1000 mg/200 mL premix (1,000 mg Intravenous New Bag/Given 10/26/23 1143)  lactated ringers  bolus 1,000 mL (0 mLs Intravenous Stopped 10/26/23 0928)  piperacillin -tazobactam (ZOSYN ) IVPB 3.375 g (0 g Intravenous Stopped 10/26/23 0838)  HYDROmorphone  (DILAUDID ) injection 0.5 mg (0.5 mg  Intravenous Given 10/26/23 0803)  ketorolac  (TORADOL ) 15 MG/ML injection 15 mg (15 mg Intravenous Given 10/26/23 0806)  potassium chloride  SA (KLOR-CON  M) CR tablet 40 mEq (40 mEq Oral Given 10/26/23 0806)  iohexol  (OMNIPAQUE ) 300 MG/ML solution 100 mL (100 mLs Intravenous Contrast Given 10/26/23 0842)  HYDROmorphone  (DILAUDID ) injection 0.5 mg (0.5 mg Intravenous Given 10/26/23 1050)    ED Course/ Medical Decision Making/ A&P Clinical Course as of 10/26/23 1207  Mon Oct 26, 2023  1111 Dr Alto Atta from [facility] consulted and will review the patient's case. [RP]  1120 Patient accepted to [facility] specialty care by Dr. Alto Atta. [RP]    Clinical Course User Index [RP] Ninetta Basket, MD                                 Medical Decision Making Amount and/or Complexity of Data Reviewed Labs: ordered. Radiology: ordered.  Risk Prescription drug management.   Caroline Lynch is a 27 y.o. female with comorbidities that complicate the patient evaluation including surgical abortion on 10/20/2023 who presents emergency department with abdominal pain and fever  Initial Ddx:  Septic abortion, retained products of conception, perforation, abscess, postop infection  MDM/Course:  Patient presents emergency room with lower abdominal pain and fever in the setting of recent surgical abortion.  She is currently postop day 7.  Also is having some vaginal bleeding.  Yesterday had an ultrasound that was negative for acute abnormality.  She has white count of 10.9 with a temperature of 101.7 and a heart rate of 110.  Blood pressure was WNL.  Sepsis protocol was initiated with Zosyn  given due to concerns for intra-abdominal infection.  CT abdomen pelvis with bilateral ovarian cyst concerning for endometriomas that were seen previously.  Upon re-evaluation patient was feeling much better.  Discussed with OB/GYN who recommended adding vancomycin  and repeating that pelvic ultrasound and having her transferred to [facility] specialty care  for further management.  This patient presents to the ED for concern of complaints listed in HPI, this involves an extensive number of treatment options, and is a complaint that carries with it a high risk of complications and morbidity. Disposition including potential need for admission considered.   Dispo: Admit to Floor  Additional history obtained from significant other Records reviewed Outpatient Clinic Notes The following labs were independently interpreted: Chemistry and show no acute abnormality I independently reviewed the following imaging with scope of interpretation limited to determining acute life threatening conditions related to emergency care: CT Abdomen/Pelvis and agree with the radiologist interpretation with the following exceptions: none I personally reviewed and  interpreted cardiac monitoring: normal sinus rhythm  I personally reviewed and interpreted the pt's EKG: see above for interpretation  I have reviewed the patients home medications and made adjustments as needed Consults: OBGYN  Portions of this note were generated with Scientist, clinical (histocompatibility and immunogenetics). Dictation errors may occur despite best attempts at proofreading.     CRITICAL CARE Performed by: Ninetta Basket   Total critical care time: 30 minutes  Critical care time was exclusive of separately billable procedures and treating other patients.  Critical care was necessary to treat or prevent imminent or life-threatening deterioration.  Critical care was time spent personally by me on the following activities: development of treatment plan with patient and/or surrogate as well as nursing, discussions with consultants, evaluation of patient's response to treatment, examination of patient, obtaining history from patient or surrogate, ordering and performing treatments and interventions, ordering and review of laboratory studies, ordering and review of radiographic studies, pulse oximetry and re-evaluation of  patient's condition.    Final Clinical Impression(s) / ED Diagnoses Final diagnoses:  Abortion with septicemia  Sepsis, due to unspecified organism, unspecified whether acute organ dysfunction present [facility])    Rx / DC Orders ED Discharge Orders     None         Ninetta Basket, MD 10/26/23 1207

## 2023-10-26 NOTE — Progress Notes (Addendum)
 Pharmacy Antibiotic Note  Caroline Lynch is a 28 y.o. female admitted on 10/26/2023 with sepsis s/p surgical abortion.  Pharmacy has been consulted for zosyn  and vancomycin  dosing.  First trimester surgical abortion 6 days ago (Tuesday 4/15)  Febrile, Tm 101.7, WBC trending up now 10.9, was previously tachycardic at 110, now HR 70-80s CT with no new findings (endometriomas previously identified present) Zosyn  3.375mg  IV x1 and vancomycin  1000mg  IV x1 given in ED   Plan: Continue Zosyn  3.375mg  IV every 8 hours Vancomycin  1000mg  IV every 12 hours  Goal AUC 400-600 eAUC 491 (Scr 0.74, IBW, Vd 0.72) Monitor CBC, renal function, cultures, and signs of clinical improvement Deescalate as able  Height: 5\' 7"  (170.2 cm) Weight: 57.6 kg (127 lb) IBW/kg (Calculated) : 61.6  Temp (24hrs), Avg:99.9 F (37.7 C), Min:98.1 F (36.7 C), Max:101.7 F (38.7 C)  Recent Labs  Lab 10/25/23 1338 10/26/23 0644  WBC 7.7 10.9*  CREATININE 0.74 0.74  LATICACIDVEN  --  0.9    Estimated Creatinine Clearance: 97.8 mL/min (by C-G formula based on SCr of 0.74 mg/dL).    No Known Allergies  Antimicrobials this admission: 4/21 zosyn  >>  4/21 vancomycin  >>    Microbiology results: 4/21 BCx: sent   Thank you for allowing pharmacy to be a part of this patient's care.  Jerrel Mor, PharmD PGY1 Pharmacy Resident 10/26/2023 3:21 PM

## 2023-10-26 NOTE — ED Triage Notes (Addendum)
 Pt had TAB on Tuesday, and has had abd pain, and bleeding for 2 days. This morning she noticed she also had a fever. Temp on arrival 101.7, HR 110. Pt also reports pain has increased since yesterday. Took Tylenol  ~1 hour ago.

## 2023-10-26 NOTE — ED Notes (Signed)
 2nd set of cultures collected at [phone]

## 2023-10-27 DIAGNOSIS — O0387 Sepsis following complete or unspecified spontaneous abortion: Principal | ICD-10-CM

## 2023-10-27 DIAGNOSIS — O0337 Sepsis following incomplete spontaneous abortion: Secondary | ICD-10-CM | POA: Diagnosis not present

## 2023-10-27 DIAGNOSIS — O0482 Renal failure following (induced) termination of pregnancy: Secondary | ICD-10-CM | POA: Diagnosis present

## 2023-10-27 DIAGNOSIS — Z3A00 Weeks of gestation of pregnancy not specified: Secondary | ICD-10-CM

## 2023-10-27 DIAGNOSIS — N80123 Deep endometriosis of bilateral ovaries: Secondary | ICD-10-CM | POA: Diagnosis present

## 2023-10-27 DIAGNOSIS — Z3A01 Less than 8 weeks gestation of pregnancy: Secondary | ICD-10-CM | POA: Diagnosis not present

## 2023-10-27 DIAGNOSIS — F17210 Nicotine dependence, cigarettes, uncomplicated: Secondary | ICD-10-CM | POA: Diagnosis present

## 2023-10-27 LAB — CBC WITH DIFFERENTIAL/PLATELET
Abs Immature Granulocytes: 0.03 10*3/uL (ref 0.00–0.07)
Basophils Absolute: 0 10*3/uL (ref 0.0–0.1)
Basophils Relative: 0 %
Eosinophils Absolute: 0.1 10*3/uL (ref 0.0–0.5)
Eosinophils Relative: 1 %
HCT: 28.5 % — ABNORMAL LOW (ref 36.0–46.0)
Hemoglobin: 9.3 g/dL — ABNORMAL LOW (ref 12.0–15.0)
Immature Granulocytes: 0 %
Lymphocytes Relative: 12 %
Lymphs Abs: 1 10*3/uL (ref 0.7–4.0)
MCH: 29.4 pg (ref 26.0–34.0)
MCHC: 32.6 g/dL (ref 30.0–36.0)
MCV: 90.2 fL (ref 80.0–100.0)
Monocytes Absolute: 0.6 10*3/uL (ref 0.1–1.0)
Monocytes Relative: 6 %
Neutro Abs: 7.3 10*3/uL (ref 1.7–7.7)
Neutrophils Relative %: 81 %
Platelets: 205 10*3/uL (ref 150–400)
RBC: 3.16 MIL/uL — ABNORMAL LOW (ref 3.87–5.11)
RDW: 13.9 % (ref 11.5–15.5)
WBC: 9.1 10*3/uL (ref 4.0–10.5)
nRBC: 0 % (ref 0.0–0.2)

## 2023-10-27 LAB — BASIC METABOLIC PANEL WITH GFR
Anion gap: 13 (ref 5–15)
BUN: 7 mg/dL (ref 6–20)
CO2: 18 mmol/L — ABNORMAL LOW (ref 22–32)
Calcium: 8.4 mg/dL — ABNORMAL LOW (ref 8.9–10.3)
Chloride: 103 mmol/L (ref 98–111)
Creatinine, Ser: 1.26 mg/dL — ABNORMAL HIGH (ref 0.44–1.00)
GFR, Estimated: >60 mL/min (ref >60)
Glucose, Bld: 71 mg/dL (ref 70–99)
Potassium: 3.3 mmol/L — ABNORMAL LOW (ref 3.5–5.1)
Sodium: 134 mmol/L — ABNORMAL LOW (ref 135–145)

## 2023-10-27 NOTE — Progress Notes (Addendum)
 Gynecology Progress Note  Admission Date: 10/26/2023 Current Date: 10/27/2023 11:20 AM  Caroline Lynch is a 28 y.o. HD2 s/p admission for septic abortion  Started on vanc/zosyn  yesterday. Reports feeling MUCH better today. Hasn't had much bleeding. Reports some nausea. Hungry. Denies chest pain/shortness of breath.  History complicated by: Patient Active Problem List   Diagnosis Date Noted   Abortion with septicemia 10/26/2023   Sepsis following complete or unspecified spontaneous abortion 10/26/2023   Ovarian cyst 11/07/2021    ROS and patient/family/surgical history, located on admission H&P note dated 10/26/2023, have been reviewed, and there are no changes except as noted below    Objective:   Vitals:   10/26/23 1430 10/26/23 2013 10/27/23 0007 10/27/23 0502  BP: 128/75 (!) 104/53 (!) 112/58 107/67  Pulse: 88 78 69 65  Resp: 16 17 17 18   Temp:  99.6 F (37.6 C) 98.6 F (37 C) 98.5 F (36.9 C)  TempSrc: Oral Oral Oral Oral  SpO2: 100% 100% 99% 100%  Weight:      Height:        Temp:  [98.1 F (36.7 C)-99.6 F (37.6 C)] 98.5 F (36.9 C) (04/22 0502) Pulse Rate:  [65-88] 65 (04/22 0502) Resp:  [16-18] 18 (04/22 0502) BP: (100-128)/(53-82) 107/67 (04/22 0502) SpO2:  [99 %-100 %] 100 % (04/22 0502) I/O last 3 completed shifts: In: 944.6 [I.V.:413.8; IV Piggyback:530.7] Out: -  No intake/output data recorded.  Intake/Output Summary (Last 24 hours) at 10/27/2023 1120 Last data filed at 10/27/2023 0400 Gross per 24 hour  Intake 894.55 ml  Output --  Net 894.55 ml     Current Vital Signs 24h Vital Sign Ranges  T 98.5 F (36.9 C) Temp  Avg: 98.7 F (37.1 C)  Min: 98.1 F (36.7 C)  Max: 99.6 F (37.6 C)  BP 107/67 BP  Min: 100/66  Max: 128/75  HR 65 Pulse  Avg: 73.3  Min: 65  Max: 88  RR 18 Resp  Avg: 17.2  Min: 16  Max: 18  SaO2 100 % Room Air SpO2  Avg: 99.8 %  Min: 99 %  Max: 100 %       24 Hour I/O Current Shift I/O  Time Ins Outs 04/21 0701 - 04/22  0700 In: 944.6 [I.V.:413.8] Out: -  No intake/output data recorded.   Patient Vitals for the past 12 hrs:  BP Temp Temp src Pulse Resp SpO2  10/27/23 0502 107/67 98.5 F (36.9 C) Oral 65 18 100 %  10/27/23 0007 (!) 112/58 98.6 F (37 C) Oral 69 17 99 %     Patient Vitals for the past 24 hrs:  BP Temp Temp src Pulse Resp SpO2  10/27/23 0502 107/67 98.5 F (36.9 C) Oral 65 18 100 %  10/27/23 0007 (!) 112/58 98.6 F (37 C) Oral 69 17 99 %  10/26/23 2013 (!) 104/53 99.6 F (37.6 C) Oral 78 17 100 %  10/26/23 1430 128/75 -- Oral 88 16 100 %  10/26/23 1305 100/66 98.1 F (36.7 C) Oral 72 18 100 %  10/26/23 1200 119/82 -- -- 68 17 100 %    Physical exam: General appearance: alert, cooperative, and appears stated age Abdomen: soft, non-tender; bowel sounds normal; no masses,  no organomegaly GU: No gross VB Lungs: clear to auscultation bilaterally Heart: regular rate and rhythm Extremities: no lower extremity edema Psych: appropriate Neurologic: Grossly normal  Medications Current Facility-Administered Medications  Medication Dose Route Frequency Provider Last Rate Last Admin  0.9 %  sodium chloride  infusion   Intravenous Continuous Marci Setter, MD 125 mL/hr at 10/26/23 1529 New Bag at 10/26/23 1529   acetaminophen  (TYLENOL ) tablet 1,000 mg  1,000 mg Oral Q6H Marci Setter, MD   1,000 mg at 10/27/23 0347   docusate sodium  (COLACE) capsule 100 mg  100 mg Oral BID Marci Setter, MD   100 mg at 10/26/23 2137   morphine  (PF) 2 MG/ML injection 1-2 mg  1-2 mg Intravenous Q3H PRN Marci Setter, MD   2 mg at 10/26/23 1548   ondansetron  (ZOFRAN ) tablet 4 mg  4 mg Oral Q6H PRN Marci Setter, MD       Or   ondansetron  (ZOFRAN ) injection 4 mg  4 mg Intravenous Q6H PRN Marci Setter, MD   4 mg at 10/27/23 1610   oxyCODONE  (Oxy IR/ROXICODONE ) immediate release tablet 5 mg  5 mg Oral Q4H PRN Marci Setter, MD   5 mg at 10/26/23 2238   piperacillin -tazobactam (ZOSYN )  IVPB 3.375 g  3.375 g Intravenous Q8H Marci Setter, MD 12.5 mL/hr at 10/27/23 0540 3.375 g at 10/27/23 0540   prenatal multivitamin tablet 1 tablet  1 tablet Oral Q1200 Marci Setter, MD       simethicone  [facility]) chewable tablet 80 mg  80 mg Oral QID PRN Marci Setter, MD          Labs  Recent Labs  Lab 10/25/23 1338 10/26/23 0644 10/27/23 0709  WBC 7.7 10.9* 9.1  HGB 10.7* 11.1* 9.3*  HCT 32.3* 33.3* 28.5*  PLT 248 250 205    Recent Labs  Lab 10/25/23 1338 10/26/23 0644 10/27/23 0709  NA 135 135 134*  K 3.6 3.3* 3.3*  CL 104 104 103  CO2 22 21* 18*  BUN 10 11 7   CREATININE 0.74 0.74 1.26*  CALCIUM 9.0 9.2 8.4*  PROT  --  7.7  --   BILITOT  --  0.6  --   ALKPHOS  --  41  --   ALT  --  10  --   AST  --  16  --   GLUCOSE 95 128* 71    Radiology Pelvic ultrasound 4/21: IMPRESSION: New small volume fluid within the endometrial canal, which contains avascular irregular echogenicities. These may reflect retained products of conception in the appropriate clinical setting.  Assessment & Plan:  Septic abortion: Has been afebrile and normal vitals since yesterday morning. Normal WBC count this morning. Repeat ultrasound with some blood noted within the uterus. Clinically improving. Physical exam much improved from yesterday and patient subjectively feeling much better. No obvious retained products on imaging yesterday or today. Will continue with medical management and consider surgical intervention if not continuing to improve. Blood cultures NGTD  -- continue zosyn   -- continue IVF  -- daily labs  Elevated creatinine: hold NSAIDs, d/c vanc, continue IVF, repeat BMP tomorrow  FEN/GI: regular diet, continue IVF  4. Pain:              -- scheduled tylenol              -- oxycodone  and morphine  prn   5. Dispo: inpatient management   Code Status: Full Code  Total time taking care of the patient was 20 minutes, with greater than 50% of the time spent in  face to face interaction with the patient.  Marci Setter, MD, FACOG Obstetrician & Gynecologist, [facility] for [employer],  [facility]

## 2023-10-27 NOTE — Plan of Care (Signed)

## 2023-10-28 LAB — CBC
HCT: 28.9 % — ABNORMAL LOW (ref 36.0–46.0)
Hemoglobin: 9.4 g/dL — ABNORMAL LOW (ref 12.0–15.0)
MCH: 29.2 pg (ref 26.0–34.0)
MCHC: 32.5 g/dL (ref 30.0–36.0)
MCV: 89.8 fL (ref 80.0–100.0)
Platelets: 257 10*3/uL (ref 150–400)
RBC: 3.22 MIL/uL — ABNORMAL LOW (ref 3.87–5.11)
RDW: 13.9 % (ref 11.5–15.5)
WBC: 5.9 10*3/uL (ref 4.0–10.5)
nRBC: 0 % (ref 0.0–0.2)

## 2023-10-28 LAB — BASIC METABOLIC PANEL WITH GFR
Anion gap: 11 (ref 5–15)
BUN: 7 mg/dL (ref 6–20)
CO2: 20 mmol/L — ABNORMAL LOW (ref 22–32)
Calcium: 8.4 mg/dL — ABNORMAL LOW (ref 8.9–10.3)
Chloride: 104 mmol/L (ref 98–111)
Creatinine, Ser: 0.99 mg/dL (ref 0.44–1.00)
GFR, Estimated: >60 mL/min (ref >60)
Glucose, Bld: 92 mg/dL (ref 70–99)
Potassium: 3.6 mmol/L (ref 3.5–5.1)
Sodium: 135 mmol/L (ref 135–145)

## 2023-10-28 LAB — HCG, QUANTITATIVE, PREGNANCY: hCG, Beta Chain, Quant, S: 1144 m[IU]/mL — ABNORMAL HIGH (ref <5)

## 2023-10-28 MED ORDER — OXYCODONE HCL 5 MG PO TABS
5.0000 mg | ORAL_TABLET | ORAL | Status: DC | PRN
  Administered 2023-10-28: 10 mg via ORAL
  Administered 2023-10-29: 5 mg via ORAL
  Filled 2023-10-28: qty 2
  Filled 2023-10-28: qty 1

## 2023-10-28 MED ORDER — DOXYCYCLINE HYCLATE 100 MG PO TABS
100.0000 mg | ORAL_TABLET | Freq: Two times a day (BID) | ORAL | Status: DC
  Administered 2023-10-28 – 2023-10-29 (×4): 100 mg via ORAL
  Filled 2023-10-28 (×4): qty 1

## 2023-10-28 MED ORDER — IBUPROFEN 600 MG PO TABS: 600.0000 mg | ORAL_TABLET | Freq: Four times a day (QID) | ORAL | Status: DC | PRN

## 2023-10-28 MED ORDER — ACETAMINOPHEN 325 MG PO TABS
650.0000 mg | ORAL_TABLET | Freq: Four times a day (QID) | ORAL | Status: DC | PRN
  Administered 2023-10-28 – 2023-10-29 (×3): 650 mg via ORAL
  Filled 2023-10-28 (×3): qty 2

## 2023-10-28 MED ORDER — METRONIDAZOLE 500 MG PO TABS
500.0000 mg | ORAL_TABLET | Freq: Two times a day (BID) | ORAL | Status: DC
  Administered 2023-10-28 – 2023-10-29 (×4): 500 mg via ORAL
  Filled 2023-10-28 (×4): qty 1

## 2023-10-28 NOTE — Progress Notes (Signed)
 Gynecology Progress Note  Admission Date: 10/26/2023 Current Date: 10/28/2023 10:07 AM  Caroline Lynch is a 28 y.o. HD3 s/p admission for septic abortion  Doing well. Reports some back pain, mild menstrual like cramps, denies heavy bleeding or significant pain. Continues to feel improved.  History complicated by: Patient Active Problem List   Diagnosis Date Noted   Abortion with septicemia 10/26/2023   Sepsis following complete or unspecified spontaneous abortion 10/26/2023   Ovarian cyst 11/07/2021    ROS and patient/family/surgical history, located on admission H&P note dated 10/26/2023, have been reviewed, and there are no changes except as noted below    Objective:   Vitals:   10/27/23 2052 10/27/23 2355 10/28/23 0417 10/28/23 0821  BP:  (!) 104/54 (!) 99/56 100/63  Pulse:  77 66 88  Resp:  16 16 15   Temp: 99.5 F (37.5 C) 99.6 F (37.6 C) 98.5 F (36.9 C) 98.4 F (36.9 C)  TempSrc: Oral Oral Oral Oral  SpO2:  100% 99% 97%  Weight:      Height:        Temp:  [98.4 F (36.9 C)-99.6 F (37.6 C)] 98.4 F (36.9 C) (04/23 0821) Pulse Rate:  [66-88] 88 (04/23 0821) Resp:  [15-18] 15 (04/23 0821) BP: (99-124)/(54-78) 100/63 (04/23 0821) SpO2:  [97 %-100 %] 97 % (04/23 0821) I/O last 3 completed shifts: In: 1445 [I.V.:1044.7; IV Piggyback:400.3] Out: -  No intake/output data recorded.  Intake/Output Summary (Last 24 hours) at 10/28/2023 1007 Last data filed at 10/27/2023 1747 Gross per 24 hour  Intake 751.86 ml  Output --  Net 751.86 ml     Current Vital Signs 24h Vital Sign Ranges  T 98.4 F (36.9 C) Temp  Avg: 98.9 F (37.2 C)  Min: 98.4 F (36.9 C)  Max: 99.6 F (37.6 C)  BP 100/63 BP  Min: 99/56  Max: 124/78  HR 88 Pulse  Avg: 78.6  Min: 66  Max: 88  RR 15 Resp  Avg: 16.1  Min: 15  Max: 18  SaO2 97 % Room Air SpO2  Avg: 99.1 %  Min: 97 %  Max: 100 %       24 Hour I/O Current Shift I/O  Time Ins Outs 04/22 0701 - 04/23 0700 In: 751.9  [I.V.:630.9] Out: -  No intake/output data recorded.   Patient Vitals for the past 12 hrs:  BP Temp Temp src Pulse Resp SpO2  10/28/23 0821 100/63 98.4 F (36.9 C) Oral 88 15 97 %  10/28/23 0417 (!) 99/56 98.5 F (36.9 C) Oral 66 16 99 %  10/27/23 2355 (!) 104/54 99.6 F (37.6 C) Oral 77 16 100 %     Patient Vitals for the past 24 hrs:  BP Temp Temp src Pulse Resp SpO2  10/28/23 0821 100/63 98.4 F (36.9 C) Oral 88 15 97 %  10/28/23 0417 (!) 99/56 98.5 F (36.9 C) Oral 66 16 99 %  10/27/23 2355 (!) 104/54 99.6 F (37.6 C) Oral 77 16 100 %  10/27/23 2052 -- 99.5 F (37.5 C) Oral -- -- --  10/27/23 1908 124/78 98.5 F (36.9 C) Oral 86 16 100 %  10/27/23 1715 121/73 98.6 F (37 C) Oral 70 16 99 %  10/27/23 1321 113/67 99.4 F (37.4 C) Oral 87 16 100 %  10/27/23 1117 110/60 98.6 F (37 C) Oral 76 18 99 %    Physical exam: General appearance: alert, cooperative, and appears stated age Abdomen: soft, non-tender; bowel sounds  normal; no masses,  no organomegaly GU: No gross VB Lungs: clear to auscultation bilaterally Heart: regular rate and rhythm Extremities: no lower extremity edema Psych: appropriate Neurologic: Grossly normal  Medications Current Facility-Administered Medications  Medication Dose Route Frequency Provider Last Rate Last Admin   acetaminophen  (TYLENOL ) tablet 650 mg  650 mg Oral Q6H PRN Marci Setter, MD       docusate sodium  (COLACE) capsule 100 mg  100 mg Oral BID Marci Setter, MD   100 mg at 10/28/23 1610   doxycycline  (VIBRA -TABS) tablet 100 mg  100 mg Oral Q12H Marci Setter, MD       metroNIDAZOLE  (FLAGYL ) tablet 500 mg  500 mg Oral Q12H Marci Setter, MD       morphine  (PF) 2 MG/ML injection 1-2 mg  1-2 mg Intravenous Q3H PRN Marci Setter, MD   2 mg at 10/26/23 1548   ondansetron  (ZOFRAN ) tablet 4 mg  4 mg Oral Q6H PRN Marci Setter, MD       Or   ondansetron  (ZOFRAN ) injection 4 mg  4 mg Intravenous Q6H PRN Marci Setter, MD   4 mg at 10/27/23 0412   oxyCODONE  (Oxy IR/ROXICODONE ) immediate release tablet 5 mg  5 mg Oral Q4H PRN Marci Setter, MD   5 mg at 10/28/23 0841   prenatal multivitamin tablet 1 tablet  1 tablet Oral Q1200 Marci Setter, MD   1 tablet at 10/27/23 1306   simethicone  (MYLICON) chewable tablet 80 mg  80 mg Oral QID PRN Marci Setter, MD          Labs  Recent Labs  Lab 10/26/23 [phone] 10/27/23 0709 10/28/23 0702  WBC 10.9* 9.1 5.9  HGB 11.1* 9.3* 9.4*  HCT 33.3* 28.5* 28.9*  PLT 250 205 257    Recent Labs  Lab 10/26/23 0644 10/27/23 0709 10/28/23 0702  NA 135 134* 135  K 3.3* 3.3* 3.6  CL 104 103 104  CO2 21* 18* 20*  BUN 11 7 7   CREATININE 0.74 1.26* 0.99  CALCIUM 9.2 8.4* 8.4*  PROT 7.7  --   --   BILITOT 0.6  --   --   ALKPHOS 41  --   --   ALT 10  --   --   AST 16  --   --   GLUCOSE 128* 71 92    Radiology Pelvic ultrasound 4/21: IMPRESSION: New small volume fluid within the endometrial canal, which contains avascular irregular echogenicities. These may reflect retained products of conception in the appropriate clinical setting.  Assessment & Plan:  Septic abortion: Afebrile since admission ( x 48 hours). Responding well to medical management. Blood cultures no growth x 2 days. HCG downtrending appropriately  -- discontinue zosyn   -- start oral doxy/flagyl   -- tylenol  prn  -- likely discharge home tomorrow  Acute Kidney Injury: resolved, Cr 0.99 this am  FEN/GI: regular diet, discontinue IVF, saline lock  4. Pain:              -- tylenol  prn             -- oxycodone  and morphine  prn   5. Dispo: likely discharge home tomorrow   Total time taking care of the patient was 25 minutes, with greater than 50% of the time spent in face to face interaction with the patient.  Marci Setter, MD, FACOG Obstetrician & Gynecologist, [facility] for [facility], [facility]  [facility]

## 2023-10-29 DIAGNOSIS — N179 Acute kidney failure, unspecified: Secondary | ICD-10-CM | POA: Diagnosis not present

## 2023-10-29 DIAGNOSIS — N80123 Deep endometriosis of bilateral ovaries: Secondary | ICD-10-CM | POA: Diagnosis present

## 2023-10-29 LAB — CBC
HCT: 29.1 % — ABNORMAL LOW (ref 36.0–46.0)
Hemoglobin: 9.5 g/dL — ABNORMAL LOW (ref 12.0–15.0)
MCH: 29.1 pg (ref 26.0–34.0)
MCHC: 32.6 g/dL (ref 30.0–36.0)
MCV: 89 fL (ref 80.0–100.0)
Platelets: 280 10*3/uL (ref 150–400)
RBC: 3.27 MIL/uL — ABNORMAL LOW (ref 3.87–5.11)
RDW: 13.8 % (ref 11.5–15.5)
WBC: 6.3 10*3/uL (ref 4.0–10.5)
nRBC: 0 % (ref 0.0–0.2)

## 2023-10-29 LAB — BASIC METABOLIC PANEL WITH GFR
Anion gap: 11 (ref 5–15)
BUN: <5 mg/dL — ABNORMAL LOW (ref 6–20)
CO2: 22 mmol/L (ref 22–32)
Calcium: 8.8 mg/dL — ABNORMAL LOW (ref 8.9–10.3)
Chloride: 104 mmol/L (ref 98–111)
Creatinine, Ser: 0.79 mg/dL (ref 0.44–1.00)
GFR, Estimated: >60 mL/min (ref >60)
Glucose, Bld: 86 mg/dL (ref 70–99)
Potassium: 3.5 mmol/L (ref 3.5–5.1)
Sodium: 137 mmol/L (ref 135–145)

## 2023-10-29 LAB — TYPE AND SCREEN
ABO/RH(D): O POS
Antibody Screen: NEGATIVE

## 2023-10-29 LAB — ABO/RH: ABO/RH(D): O POS

## 2023-10-29 NOTE — Plan of Care (Signed)
  Problem: Education: Goal: Knowledge of General Education information will improve Description: Including pain rating scale, medication(s)/side effects and non-pharmacologic comfort measures Outcome: Progressing   Problem: Health Behavior/Discharge Planning: Goal: Ability to manage health-related needs will improve Outcome: Progressing   Problem: Clinical Measurements: Goal: Ability to maintain clinical measurements within normal limits will improve Outcome: Progressing Goal: Will remain free from infection Outcome: Progressing Goal: Diagnostic test results will improve Outcome: Progressing Goal: Respiratory complications will improve Outcome: Progressing   Problem: Activity: Goal: Risk for activity intolerance will decrease Outcome: Progressing   Problem: Nutrition: Goal: Adequate nutrition will be maintained Outcome: Progressing   Problem: Coping: Goal: Level of anxiety will decrease Outcome: Progressing   Problem: Pain Managment: Goal: General experience of comfort will improve and/or be controlled Outcome: Progressing

## 2023-10-29 NOTE — Progress Notes (Signed)
 Gynecology Progress Note  Admission Date: 10/26/2023 Current Date: 10/29/2023 11:46 AM  Caroline Lynch is a 28 y.o. No obstetric history on file. HD#4 admitted for septic abortion.   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  She overall is doing well.  She denies fever and chills but did notice an uptick in pelvic cramping.  She was left NPO by offgoing MD.    Objective:   Vitals:   10/28/23 1937 10/28/23 2329 10/29/23 0344 10/29/23 0942  BP: 112/63 120/70 116/73 111/64  Pulse: 64 82 69 67  Resp: 16 18 16 16   Temp: 99.6 F (37.6 C) 99.4 F (37.4 C) 98.8 F (37.1 C) 98.8 F (37.1 C)  TempSrc: Oral Oral Oral Oral  SpO2: 100% 99% 98% 100%  Weight:      Height:        Temp:  [98 F (36.7 C)-99.6 F (37.6 C)] 98.8 F (37.1 C) (04/24 0942) Pulse Rate:  [58-82] 67 (04/24 0942) Resp:  [16-18] 16 (04/24 0942) BP: (102-125)/(59-82) 111/64 (04/24 0942) SpO2:  [98 %-100 %] 100 % (04/24 0942) I/O last 3 completed shifts: In: 240 [P.O.:240] Out: -  No intake/output data recorded.  Intake/Output Summary (Last 24 hours) at 10/29/2023 1146 Last data filed at 10/28/2023 2221 Gross per 24 hour  Intake 240 ml  Output --  Net 240 ml     Current Vital Signs 24h Vital Sign Ranges  T 98.8 F (37.1 C) Temp  Avg: 98.8 F (37.1 C)  Min: 98 F (36.7 C)  Max: 99.6 F (37.6 C)  BP 111/64 BP  Min: 102/59  Max: 125/82  HR 67 Pulse  Avg: 69.2  Min: 58  Max: 82  RR 16 Resp  Avg: 16.7  Min: 16  Max: 18  SaO2 100 % Room Air SpO2  Avg: 99.3  %  Min: 98 %  Max: 100 %       24 Hour I/O Current Shift I/O  Time Ins Outs 04/23 0701 - 04/24 0700 In: 240 [P.O.:240] Out: -  No intake/output data recorded.   Patient Vitals for the past 12 hrs:  BP Temp Temp src Pulse Resp SpO2  10/29/23 0942 111/64 98.8 F (37.1 C) Oral 67 16 100 %  10/29/23 0344 116/73 98.8 F (37.1 C) Oral 69 16 98 %     Patient Vitals for the past 24 hrs:  BP Temp Temp src Pulse Resp SpO2  10/29/23 0942 111/64 98.8 F (37.1 C) Oral 67 16 100 %  10/29/23 0344 116/73 98.8 F (37.1 C) Oral 69 16 98 %  10/28/23 2329 120/70 99.4 F (37.4 C) Oral 82 18 99 %  10/28/23 1937 112/63 99.6 F (37.6 C) Oral 64 16 100 %  10/28/23 1553 125/82 98.2 F (36.8 C) Oral 75 18 100 %  10/28/23 1207 (!) 102/59 98 F (36.7 C) Oral (!) 58 16 99 %    Physical exam: General appearance: alert, cooperative, appears stated age, and no distress Abdomen: soft, non-tender; bowel sounds normal; no masses,  no organomegaly GU: No gross  VB Lungs: clear to auscultation bilaterally Heart: regular rate and rhythm Extremities: no lower extremity edema Skin: WNL Psych: appropriate Neurologic: Grossly normal  Medications Current Facility-Administered Medications  Medication Dose Route Frequency Provider Last Rate Last Admin   acetaminophen  (TYLENOL ) tablet 650 mg  650 mg Oral Q6H PRN Marci Setter, MD   650 mg at 10/28/23 1610   doxycycline  (VIBRA -TABS) tablet 100 mg  100 mg Oral Q12H Marci Setter, MD   100 mg at 10/28/23 2232   ibuprofen  (ADVIL ) tablet 600 mg  600 mg Oral Q6H PRN Raynell Caller, MD       metroNIDAZOLE  (FLAGYL ) tablet 500 mg  500 mg Oral Q12H Marci Setter, MD   500 mg at 10/28/23 2232   ondansetron  (ZOFRAN ) tablet 4 mg  4 mg Oral Q6H PRN Marci Setter, MD   4 mg at 10/28/23 1603   Or   ondansetron  (ZOFRAN ) injection 4 mg  4 mg Intravenous Q6H PRN Marci Setter, MD   4 mg at 10/27/23 0412   oxyCODONE  (Oxy IR/ROXICODONE ) immediate release tablet  5-10 mg  5-10 mg Oral Q4H PRN Raynell Caller, MD   5 mg at 10/29/23 2841   prenatal multivitamin tablet 1 tablet  1 tablet Oral Q1200 Marci Setter, MD   1 tablet at 10/27/23 1306   simethicone  (MYLICON) chewable tablet 80 mg  80 mg Oral QID PRN Marci Setter, MD          Labs  Recent Labs  Lab 10/27/23 [phone] 10/28/23 0702 10/29/23 0431  WBC 9.1 5.9 6.3  HGB 9.3* 9.4* 9.5*  HCT 28.5* 28.9* 29.1*  PLT 205 257 280    Recent Labs  Lab 10/26/23 0644 10/27/23 0709 10/28/23 0702 10/29/23 0431  NA 135 134* 135 137  K 3.3* 3.3* 3.6 3.5  CL 104 103 104 104  CO2 21* 18* 20* 22  BUN 11 7 7  <5*  CREATININE 0.74 1.26* 0.99 0.79  CALCIUM 9.2 8.4* 8.4* 8.8*  PROT 7.7  --   --   --   BILITOT 0.6  --   --   --   ALKPHOS 41  --   --   --   ALT 10  --   --   --   AST 16  --   --   --   GLUCOSE 128* 71 92 86    Radiology New small volume fluid within the endometrial canal, which contains avascular irregular echogenicities. These may reflect retained products of conception in the appropriate clinical setting.  Assessment & Plan:  Septic abortion Clinically, the patient is stable.  However due to increased cramping and the ultrasound findings, she wishes to have definitive therapy.  We have discussed ultrasound guided D and C with the patient and her mother.  Tried to add on patient for today, but no OR time until after 5 pm.  Pt scheduled for ultrasound guided D and C at 0900 on 10/30/23.  Code Status: Full Code  Avie Boeck, MD  Attending Center for [facility])

## 2023-10-30 ENCOUNTER — Other Ambulatory Visit: Payer: Self-pay

## 2023-10-30 ENCOUNTER — Other Ambulatory Visit (HOSPITAL_COMMUNITY): Payer: Self-pay

## 2023-10-30 ENCOUNTER — Encounter (HOSPITAL_COMMUNITY)
Admission: EM | Disposition: A | Discharge status: Home / Self Care | Payer: Self-pay | Source: Home / Self Care | Attending: Obstetrics and Gynecology

## 2023-10-30 ENCOUNTER — Encounter (HOSPITAL_COMMUNITY): Payer: Self-pay | Admitting: Obstetrics and Gynecology

## 2023-10-30 ENCOUNTER — Inpatient Hospital Stay (HOSPITAL_COMMUNITY): Payer: Self-pay | Admitting: Anesthesiology

## 2023-10-30 ENCOUNTER — Inpatient Hospital Stay (HOSPITAL_COMMUNITY)

## 2023-10-30 DIAGNOSIS — Z3A00 Weeks of gestation of pregnancy not specified: Secondary | ICD-10-CM

## 2023-10-30 DIAGNOSIS — O0337 Sepsis following incomplete spontaneous abortion: Secondary | ICD-10-CM | POA: Diagnosis not present

## 2023-10-30 DIAGNOSIS — Z3A01 Less than 8 weeks gestation of pregnancy: Secondary | ICD-10-CM | POA: Diagnosis not present

## 2023-10-30 HISTORY — PX: DILATION AND CURETTAGE OF UTERUS: SHX78

## 2023-10-30 LAB — CBC
HCT: 29.5 % — ABNORMAL LOW (ref 36.0–46.0)
Hemoglobin: 9.6 g/dL — ABNORMAL LOW (ref 12.0–15.0)
MCH: 29 pg (ref 26.0–34.0)
MCHC: 32.5 g/dL (ref 30.0–36.0)
MCV: 89.1 fL (ref 80.0–100.0)
Platelets: 311 10*3/uL (ref 150–400)
RBC: 3.31 MIL/uL — ABNORMAL LOW (ref 3.87–5.11)
RDW: 13.9 % (ref 11.5–15.5)
WBC: 5.2 10*3/uL (ref 4.0–10.5)
nRBC: 0 % (ref 0.0–0.2)

## 2023-10-30 SURGERY — DILATION AND CURETTAGE
Anesthesia: General | Site: Vagina

## 2023-10-30 MED ORDER — MIDAZOLAM HCL 2 MG/2ML IJ SOLN
INTRAMUSCULAR | Status: DC | PRN
  Administered 2023-10-30: 2 mg via INTRAVENOUS

## 2023-10-30 MED ORDER — LIDOCAINE 2% (20 MG/ML) 5 ML SYRINGE
INTRAMUSCULAR | Status: AC
  Filled 2023-10-30: qty 5

## 2023-10-30 MED ORDER — ACETAMINOPHEN 500 MG PO TABS
1000.0000 mg | ORAL_TABLET | ORAL | Status: AC
  Administered 2023-10-30: 1000 mg via ORAL
  Filled 2023-10-30 (×2): qty 2

## 2023-10-30 MED ORDER — PROPOFOL 10 MG/ML IV BOLUS
INTRAVENOUS | Status: DC | PRN
  Administered 2023-10-30: 150 mg via INTRAVENOUS
  Administered 2023-10-30: 50 mg via INTRAVENOUS

## 2023-10-30 MED ORDER — 0.9 % SODIUM CHLORIDE (POUR BTL) OPTIME
TOPICAL | Status: DC | PRN
  Administered 2023-10-30: 1000 mL

## 2023-10-30 MED ORDER — CHLORHEXIDINE GLUCONATE 0.12 % MT SOLN
OROMUCOSAL | Status: AC
  Administered 2023-10-30: 15 mL via OROMUCOSAL
  Filled 2023-10-30: qty 15

## 2023-10-30 MED ORDER — DOXYCYCLINE HYCLATE 100 MG PO TABS
100.0000 mg | ORAL_TABLET | Freq: Two times a day (BID) | ORAL | 0 refills | Status: AC
  Filled 2023-10-30: qty 10, 5d supply, fill #0

## 2023-10-30 MED ORDER — FENTANYL CITRATE (PF) 250 MCG/5ML IJ SOLN
INTRAMUSCULAR | Status: AC
  Filled 2023-10-30: qty 5

## 2023-10-30 MED ORDER — SOD CITRATE-CITRIC ACID 500-334 MG/5ML PO SOLN
30.0000 mL | ORAL | Status: AC
  Administered 2023-10-30: 30 mL via ORAL
  Filled 2023-10-30: qty 30

## 2023-10-30 MED ORDER — METHYLERGONOVINE MALEATE 0.2 MG/ML IJ SOLN
INTRAMUSCULAR | Status: AC
  Filled 2023-10-30: qty 1

## 2023-10-30 MED ORDER — PROPOFOL 10 MG/ML IV BOLUS
INTRAVENOUS | Status: AC
  Filled 2023-10-30: qty 20

## 2023-10-30 MED ORDER — PHENYLEPHRINE 80 MCG/ML (10ML) SYRINGE FOR IV PUSH (FOR BLOOD PRESSURE SUPPORT)
PREFILLED_SYRINGE | INTRAVENOUS | Status: AC
  Filled 2023-10-30: qty 10

## 2023-10-30 MED ORDER — TRANEXAMIC ACID-NACL 1000-0.7 MG/100ML-% IV SOLN
1000.0000 mg | Freq: Once | INTRAVENOUS | Status: AC
Start: 2023-10-30 — End: 2023-10-30
  Administered 2023-10-30: 1000 mg via INTRAVENOUS
  Filled 2023-10-30: qty 100

## 2023-10-30 MED ORDER — FENTANYL CITRATE (PF) 250 MCG/5ML IJ SOLN
INTRAMUSCULAR | Status: DC | PRN
  Administered 2023-10-30 (×3): 50 ug via INTRAVENOUS

## 2023-10-30 MED ORDER — LACTATED RINGERS IV SOLN
INTRAVENOUS | Status: DC
Start: 2023-10-30 — End: 2023-10-30

## 2023-10-30 MED ORDER — DEXAMETHASONE SODIUM PHOSPHATE 10 MG/ML IJ SOLN
INTRAMUSCULAR | Status: DC | PRN
  Administered 2023-10-30: 10 mg via INTRAVENOUS

## 2023-10-30 MED ORDER — LACTATED RINGERS IV SOLN: INTRAVENOUS | Status: DC

## 2023-10-30 MED ORDER — ONDANSETRON HCL 4 MG/2ML IJ SOLN
INTRAMUSCULAR | Status: AC
  Filled 2023-10-30: qty 2

## 2023-10-30 MED ORDER — ONDANSETRON HCL 4 MG/2ML IJ SOLN
INTRAMUSCULAR | Status: DC | PRN
  Administered 2023-10-30: 4 mg via INTRAVENOUS

## 2023-10-30 MED ORDER — DROPERIDOL 2.5 MG/ML IJ SOLN: 0.6250 mg | Freq: Once | INTRAMUSCULAR | Status: DC | PRN

## 2023-10-30 MED ORDER — LACTATED RINGERS IV BOLUS
500.0000 mL | Freq: Once | INTRAVENOUS | Status: DC
Start: 2023-10-30 — End: 2023-10-30

## 2023-10-30 MED ORDER — LIDOCAINE 2% (20 MG/ML) 5 ML SYRINGE
INTRAMUSCULAR | Status: DC | PRN
  Administered 2023-10-30: 60 mg via INTRAVENOUS

## 2023-10-30 MED ORDER — MISOPROSTOL 200 MCG PO TABS
ORAL_TABLET | ORAL | Status: AC
  Filled 2023-10-30: qty 4

## 2023-10-30 MED ORDER — KETOROLAC TROMETHAMINE 30 MG/ML IJ SOLN
INTRAMUSCULAR | Status: DC | PRN
  Administered 2023-10-30: 30 mg via INTRAVENOUS

## 2023-10-30 MED ORDER — MIDAZOLAM HCL 2 MG/2ML IJ SOLN
INTRAMUSCULAR | Status: AC
  Filled 2023-10-30: qty 2

## 2023-10-30 MED ORDER — POVIDONE-IODINE 10 % EX SWAB: 2.0000 | Freq: Once | CUTANEOUS | Status: DC

## 2023-10-30 MED ORDER — FENTANYL CITRATE (PF) 100 MCG/2ML IJ SOLN: 25.0000 ug | INTRAMUSCULAR | Status: DC | PRN

## 2023-10-30 MED ORDER — DEXAMETHASONE SODIUM PHOSPHATE 10 MG/ML IJ SOLN
INTRAMUSCULAR | Status: AC
  Filled 2023-10-30: qty 1

## 2023-10-30 MED ORDER — IBUPROFEN 600 MG PO TABS
600.0000 mg | ORAL_TABLET | Freq: Four times a day (QID) | ORAL | 0 refills | Status: DC | PRN
  Filled 2023-10-30: qty 30, 8d supply, fill #0

## 2023-10-30 MED ORDER — OXYCODONE HCL 5 MG PO TABS: 5.0000 mg | ORAL_TABLET | Freq: Once | ORAL | Status: DC | PRN

## 2023-10-30 MED ORDER — OXYCODONE HCL 5 MG/5ML PO SOLN: 5.0000 mg | Freq: Once | ORAL | Status: DC | PRN

## 2023-10-30 MED ORDER — CHLORHEXIDINE GLUCONATE 0.12 % MT SOLN: 15.0000 mL | Freq: Once | OROMUCOSAL | Status: AC

## 2023-10-30 MED ORDER — ORAL CARE MOUTH RINSE: 15.0000 mL | Freq: Once | OROMUCOSAL | Status: AC

## 2023-10-30 MED ORDER — DOXYCYCLINE HYCLATE 100 MG IV SOLR
200.0000 mg | INTRAVENOUS | Status: AC
  Administered 2023-10-30: 200 mg via INTRAVENOUS
  Filled 2023-10-30: qty 200

## 2023-10-30 SURGICAL SUPPLY — 12 items
CANISTER SUCT 3000ML PPV (MISCELLANEOUS) ×2 IMPLANT
CATH FILIFORM SPIRAL TIP 6FR (CATHETERS) IMPLANT
CATH ROBINSON RED A/P 16FR (CATHETERS) ×2 IMPLANT
CNTNR URN SCR LID CUP LEK RST (MISCELLANEOUS) ×2 IMPLANT
GLOVE SURG ORTHO 8.0 STRL STRW (GLOVE) ×2 IMPLANT
GOWN STRL REUS W/ TWL LRG LVL3 (GOWN DISPOSABLE) ×2 IMPLANT
GOWN STRL REUS W/ TWL XL LVL3 (GOWN DISPOSABLE) ×2 IMPLANT
KIT TURNOVER KIT B (KITS) ×2 IMPLANT
PACK VAGINAL MINOR WOMEN LF (CUSTOM PROCEDURE TRAY) ×2 IMPLANT
PAD OB MATERNITY 11 LF (PERSONAL CARE ITEMS) ×2 IMPLANT
TOWEL GREEN STERILE FF (TOWEL DISPOSABLE) ×4 IMPLANT
UNDERPAD 30X36 HEAVY ABSORB (UNDERPADS AND DIAPERS) ×2 IMPLANT

## 2023-10-30 NOTE — Anesthesia Procedure Notes (Signed)
 Procedure Name: LMA Insertion Date/Time: 10/30/2023 9:15 AM  Performed by: Gabe Jock, CRNAPre-anesthesia Checklist: Patient identified, Emergency Drugs available, Suction available and Patient being monitored Patient Re-evaluated:Patient Re-evaluated prior to induction Oxygen Delivery Method: Circle System Utilized Preoxygenation: Pre-oxygenation with 100% oxygen Induction Type: IV induction Ventilation: Mask ventilation without difficulty LMA: LMA inserted LMA Size: 4.0 Number of attempts: 1 Placement Confirmation: positive ETCO2 Tube secured with: Tape Dental Injury: Teeth and Oropharynx as per pre-operative assessment

## 2023-10-30 NOTE — H&P (Signed)
 OB/GYN Pre-Op History and Physical  Caroline Lynch is a 28 y.o. G1P0 presenting for ultrasound guided suction dilation and evacuation. Pt has been inpatient since 10/26/23 with possible septic [facility]  She was febrile and had pain and bleeding.  Pt responded well to IV antibiotics, but ultrasound showed possible retained products even though there was no irregular flow to the uterine lining or the ultrasound findings. Due to continued cramping and need for definitive management, pt will have ultrasound guided suction D and E to remove any remnants that may still be present in the uterine cavity.        Past Medical History:  Diagnosis Date   Endometriosis    Ovarian cyst     Past Surgical History:  Procedure Laterality Date   DIAGNOSTIC LARYNGOSCOPY  11/07/2021   Procedure: DIAGNOSTIC LARYNGOSCOPY, lysis of adhesions, peritoneal wall biopsy;  Surgeon: Granville Layer, MD;  Location: [facility] OR;  Service: Gynecology;;   THERAPEUTIC ABORTION      OB History  No obstetric history on file.    Social History   Socioeconomic History   Marital status: Single    Spouse name: Not on file   Number of children: Not on file   Years of education: Not on file   Highest education level: Not on file  Occupational History   Not on file  Tobacco Use   Smoking status: Some Days    Types: Cigarettes, E-cigarettes   Smokeless tobacco: Never  Vaping Use   Vaping status: Some Days  Substance and Sexual Activity   Alcohol use: Yes    Comment: occasional   Drug use: Yes    Types: Marijuana    Comment: occasional   Sexual activity: Not on file  Other Topics Concern   Not on file  Social History Narrative   Not on file   Social Drivers of Health   Financial Resource Strain: Not on file  Food Insecurity: No Food Insecurity (10/26/2023)   Hunger Vital Sign    Worried About Running Out of Food in the Last Year: Never true    Ran Out of Food in the  Last Year: Never true  Transportation Needs: No Transportation Needs (10/26/2023)   PRAPARE - Administrator, Civil Service (Medical): No    Lack of Transportation (Non-Medical): No  Physical Activity: Not on file  Stress: Not on file  Social Connections: Not on file    History reviewed. No pertinent family history.  Medications Prior to Admission  Medication Sig Dispense Refill Last Dose/Taking   benzonatate  (TESSALON ) 100 MG capsule Take 1 capsule (100 mg total) by mouth 3 (three) times daily as needed for cough. 21 capsule 0    etonogestrel -ethinyl estradiol  (NUVARING) 0.12-0.015 MG/24HR vaginal ring Insert vaginally and leave in place for 4 consecutive weeks, then swtich 1 each 11    ibuprofen  (ADVIL ) 600 MG tablet Take 1 tablet (600 mg total) by mouth every 8 (eight) hours as needed (pain). 15 tablet 0    promethazine  (PHENERGAN ) 12.5 MG tablet Take 1 tablet (12.5 mg total) by mouth every 8 (eight) hours as needed for nausea or vomiting. 20 tablet 0     No Known Allergies  Review of Systems: Negative except for what is mentioned in HPI.     Physical Exam: BP 126/86   Pulse 82   Temp 98.4 F (36.9 C) (Oral)   Resp 18   Ht 5\' 7"  (1.702 m)  Wt 57.6 kg   LMP 09/04/2023   SpO2 96%   BMI 19.89 kg/m  CONSTITUTIONAL: Well-developed, well-nourished female in no acute distress.  HENT:  Normocephalic, atraumatic, External right and left ear normal. Oropharynx is clear and moist EYES: Conjunctivae and EOM are normal.  NECK: Normal range of motion, supple, no masses SKIN: Skin is warm and dry. No rash noted. Not diaphoretic. No erythema. No pallor. NEUROLGIC: Alert and oriented to person, place, and time. Normal reflexes, muscle tone coordination. No cranial nerve deficit noted. PSYCHIATRIC: Normal mood and affect. Normal behavior. Normal judgment and thought content. CARDIOVASCULAR: Normal heart rate noted, regular rhythm RESPIRATORY: Effort and breath sounds normal,  no problems with respiration noted ABDOMEN: Soft, nontender, nondistended, PELVIC: Deferred MUSCULOSKELETAL: Normal range of motion. No edema and no tenderness. 2+ distal pulses.   Pertinent Labs/Studies:   Results for orders placed or performed during the hospital encounter of 10/26/23 (from the past 72 hours)  Basic metabolic panel     Status: Abnormal   Collection Time: 10/28/23  7:02 AM  Result Value Ref Range   Sodium 135 135 - 145 mmol/L   Potassium 3.6 3.5 - 5.1 mmol/L   Chloride 104 98 - 111 mmol/L   CO2 20 (L) 22 - 32 mmol/L   Glucose, Bld 92 70 - 99 mg/dL    Comment: Glucose reference range applies only to samples taken after fasting for at least 8 hours.   BUN 7 6 - 20 mg/dL   Creatinine, Ser 7.82 0.44 - 1.00 mg/dL   Calcium 8.4 (L) 8.9 - 10.3 mg/dL   GFR, Estimated >95 >62 mL/min    Comment: (NOTE) Calculated using the CKD-EPI Creatinine Equation (2021)    Anion gap 11 5 - 15    Comment: Performed at [facility], [address], [place], [place] [address]  CBC     Status: Abnormal   Collection Time: 10/28/23  7:02 AM  Result Value Ref Range   WBC 5.9 4.0 - 10.5 K/uL   RBC 3.22 (L) 3.87 - 5.11 MIL/uL   Hemoglobin 9.4 (L) 12.0 - 15.0 g/dL   HCT 57.8 (L) 46.9 - 62.9 %   MCV 89.8 80.0 - 100.0 fL   MCH 29.2 26.0 - 34.0 pg   MCHC 32.5 30.0 - 36.0 g/dL   RDW 52.8 41.3 - 24.4 %   Platelets 257 150 - 400 K/uL   nRBC 0.0 0.0 - 0.2 %    Comment: Performed at [facility], [address], [place], [place] [address]  hCG, quantitative, pregnancy     Status: Abnormal   Collection Time: 10/28/23  7:02 AM  Result Value Ref Range   hCG, Beta Chain, Quant, S 1,144 (H) <5 mIU/mL    Comment:          GEST. AGE      CONC.  (mIU/mL)   <=1 WEEK        5 - 50     2 WEEKS       50 - 500     3 WEEKS       100 - 10,000     4 WEEKS     1,000 - 30,000     5 WEEKS     3,500 - 115,000   6-8 WEEKS     12,000 - 270,000    12 WEEKS     15,000 - 220,000        FEMALE AND  NON-PREGNANT  FEMALE:     LESS THAN 5 mIU/mL Performed at [facility], [address], [place], [place] [address]   ABO/Rh     Status: None   Collection Time: 10/29/23  4:30 AM  Result Value Ref Range   ABO/RH(D)      O POS Performed at [facility], [address] [place], [place] [address]   CBC     Status: Abnormal   Collection Time: 10/29/23  4:31 AM  Result Value Ref Range   WBC 6.3 4.0 - 10.5 K/uL   RBC 3.27 (L) 3.87 - 5.11 MIL/uL   Hemoglobin 9.5 (L) 12.0 - 15.0 g/dL   HCT 09.8 (L) 11.9 - 14.7 %   MCV 89.0 80.0 - 100.0 fL   MCH 29.1 26.0 - 34.0 pg   MCHC 32.6 30.0 - 36.0 g/dL   RDW 82.9 56.2 - 13.0 %   Platelets 280 150 - 400 K/uL   nRBC 0.0 0.0 - 0.2 %    Comment: Performed at [facility], [address] [place], [place] [address]  Basic metabolic panel     Status: Abnormal   Collection Time: 10/29/23  4:31 AM  Result Value Ref Range   Sodium 137 135 - 145 mmol/L   Potassium 3.5 3.5 - 5.1 mmol/L   Chloride 104 98 - 111 mmol/L   CO2 22 22 - 32 mmol/L   Glucose, Bld 86 70 - 99 mg/dL    Comment: Glucose reference range applies only to samples taken after fasting for at least 8 hours.   BUN <5 (L) 6 - 20 mg/dL   Creatinine, Ser 4.69 0.44 - 1.00 mg/dL   Calcium 8.8 (L) 8.9 - 10.3 mg/dL   GFR, Estimated >62 >95 mL/min    Comment: (NOTE) Calculated using the CKD-EPI Creatinine Equation (2021)    Anion gap 11 5 - 15    Comment: Performed at [facility], [address], [place], [place] [address]  Type and screen [facility]     Status: None   Collection Time: 10/29/23 12:18 PM  Result Value Ref Range   ABO/RH(D) O POS    Antibody Screen NEG    Sample Expiration      11/01/2023,2359 Performed at [facility], [address] [place], [place] [address]   CBC     Status: Abnormal   Collection Time: 10/30/23  5:05 AM  Result Value Ref Range   WBC 5.2 4.0 - 10.5 K/uL   RBC 3.31 (L) 3.87 - 5.11 MIL/uL   Hemoglobin  9.6 (L) 12.0 - 15.0 g/dL   HCT 02.7 (L) 25.3 - 66.4 %   MCV 89.1 80.0 - 100.0 fL   MCH 29.0 26.0 - 34.0 pg   MCHC 32.5 30.0 - 36.0 g/dL   RDW 40.3 47.4 - 25.9 %   Platelets 311 150 - 400 K/uL   nRBC 0.0 0.0 - 0.2 %    Comment: Performed at [facility], [address], [place], [place] [address]       Assessment and Plan :Maryan Sivak is a 28 y.o. G1P0 here for ultrasound guided suction D and E.    NPO Admission labs ordered VS Q4 Risks and benefits of the procedure given including bleeding, infection, involvement of other organs as well as uterine perforation.  Procedure will be ultrasound guided.  Due to increased risk of bleeding, she will get intra-op TXA and Iv  antibiotics.   Avie Boeck, M.D. Attending Obstetrician & Gynecologist, [facility] for [employer], [facility]

## 2023-10-30 NOTE — Anesthesia Preprocedure Evaluation (Signed)
 Anesthesia Evaluation  Patient identified by MRN, date of birth, ID band Patient awake    Reviewed: Allergy & Precautions, NPO status , Patient's Chart, lab work & pertinent test results  Airway Mallampati: II  TM Distance: >3 FB Neck ROM: Full    Dental  (+) Dental Advisory Given   Pulmonary Current Smoker and Patient abstained from smoking.   breath sounds clear to auscultation       Cardiovascular negative cardio ROS  Rhythm:Regular Rate:Normal     Neuro/Psych negative neurological ROS     GI/Hepatic negative GI ROS, Neg liver ROS,,,  Endo/Other  negative endocrine ROS    Renal/GU negative Renal ROS     Musculoskeletal   Abdominal   Peds  Hematology  (+) Blood dyscrasia, anemia   Anesthesia Other Findings   Reproductive/Obstetrics                             Anesthesia Physical Anesthesia Plan  ASA: 2  Anesthesia Plan: General   Post-op Pain Management: Tylenol  PO (pre-op)*   Induction: Intravenous  PONV Risk Score and Plan: 2 and Dexamethasone , Ondansetron  and Treatment may vary due to age or medical condition  Airway Management Planned: LMA  Additional Equipment:   Intra-op Plan:   Post-operative Plan: Extubation in OR  Informed Consent: I have reviewed the patients History and Physical, chart, labs and discussed the procedure including the risks, benefits and alternatives for the proposed anesthesia with the patient or authorized representative who has indicated his/her understanding and acceptance.     Dental advisory given  Plan Discussed with: CRNA  Anesthesia Plan Comments:        Anesthesia Quick Evaluation

## 2023-10-30 NOTE — Transfer of Care (Signed)
 mediate Anesthesia Transfer of Care Note  Patient: Caroline Lynch  Procedure(s) Performed: ULTRASOUND GUIDED DILATION AND EVACUATION (Vagina )  Patient Location: PACU  Anesthesia Type:General  Level of Consciousness: drowsy  Airway & Oxygen Therapy: Patient Spontanous Breathing, Patient connected to nasal cannula oxygen, and oral airway  Post-op Assessment: Report given to RN and Post -op Vital signs reviewed and stable  Post vital signs: Reviewed and stable  Last Vitals:  Vitals Value Taken Time  BP 112/72 10/30/23 0946  Temp 97.4   Pulse 62 10/30/23 0949  Resp 14 10/30/23 0949  SpO2 100 % 10/30/23 0949  Vitals shown include unfiled device data.  Last Pain:  Vitals:   10/30/23 0836  TempSrc:   PainSc: 3          Complications: No notable events documented.

## 2023-10-30 NOTE — Op Note (Signed)
 Caroline Lynch PROCEDURE DATE: 10/30/2023  PREOPERATIVE DIAGNOSIS: s/p septic abortion, possible retained products of conception POSTOPERATIVE DIAGNOSIS: The same PROCEDURE:    ultrasound guided dilation and evacuation SURGEON:  Avie Boeck, MD  INDICATIONS: 28 y.o. G1P0 s/p treatment of septic abortion.  Ultrasound findings suggested possible residual POC.  Pt has improved, but decision made for definitive therapy.  Risks of surgery were discussed with the patient including but not limited to: bleeding which may require transfusion; infection which may require antibiotics; injury to uterus or surrounding organs; need for additional procedures including laparotomy or laparoscopy; possibility of intrauterine scarring which may impair future fertility; and other postoperative/anesthesia complications. Written informed consent was obtained.    FINDINGS:  A 6 week size uterus, moderate amounts of products of conception, specimen sent to pathology.  ANESTHESIA: General. INTRAVENOUS FLUIDS:  500 ml of LR ESTIMATED BLOOD LOSS:  Less than 10 ml. SPECIMENS:  Products of conception sent to pathology**and some products of conception were sent for [facility] genetic analysis COMPLICATIONS:  None immediate.  PROCEDURE DETAILS:  The patient received intravenous Doxycycline  while in the preoperative area.  She was then taken to the operating room where general anesthesia was administered and was found to be adequate.  After an adequate timeout was performed, she was placed in the dorsal lithotomy position and examined; then prepped and draped in the sterile manner. A vaginal speculum was then placed in the patient's vagina and a single tooth tenaculum was applied to the anterior lip of the cervix.  The cervix was gently dilated to accommodate a 6 mm suction curette that was gently advanced to the uterine fundus with ultrasound guidance. The suction device was then activated and curette slowly rotated to clear the  uterus of one small focus of material noted on the posterior wall.  Suction curettage was done until complete emptying of the uterus was confirmed.  A gentle sharp curettage was performed and the stripe was seen to be thin.  There was minimal bleeding noted and the tenaculum removed with good hemostasis noted.   All instruments were removed from the patient's vagina.  Sponge and instrument counts were correct times two  The patient tolerated the procedure well and was taken to the recovery area extubated, awake, and in stable condition.  The patient will be sent back to [facility] specialty care and discharged from there later in the day.  Avie Boeck, MD, FACOG Obstetrician & Gynecologist, [facility] for [facility], [facility]

## 2023-10-30 NOTE — Anesthesia Postprocedure Evaluation (Signed)
 Anesthesia Post Note  Patient: Caroline Lynch  Procedure(s) Performed: ULTRASOUND GUIDED DILATION AND EVACUATION (Vagina )     Patient location during evaluation: PACU Anesthesia Type: General Level of consciousness: awake and alert Pain management: pain level controlled Vital Signs Assessment: post-procedure vital signs reviewed and stable Respiratory status: spontaneous breathing, nonlabored ventilation, respiratory function stable and patient connected to nasal cannula oxygen Cardiovascular status: blood pressure returned to baseline and stable Postop Assessment: no apparent nausea or vomiting Anesthetic complications: no  No notable events documented.  Last Vitals:  Vitals:   10/30/23 1030 10/30/23 1050  BP: 130/84 119/73  Pulse: (!) 55 (!) 56  Resp: 16 17  Temp: 36.7 C 36.7 C  SpO2: 100% 100%    Last Pain:  Vitals:   10/30/23 1050  TempSrc: Oral  PainSc: 6                  Melvenia Stabs

## 2023-10-30 NOTE — Discharge Summary (Signed)
 Physician Discharge Summary  Patient ID: Caroline Lynch MRN: [id number] DOB/AGE: 1995-07-09 27 y.o.  Admit date: 10/26/2023 Discharge date: 10/30/2023  Admission Diagnoses:  Discharge Diagnoses:  Principal Problem:   Abortion with septicemia Active Problems:   Sepsis following complete or unspecified spontaneous abortion   Endometrioma of both ovaries   AKI (acute kidney injury) [facility])   Discharged Condition: good  Hospital Course: Pt was admitted 6 days post Surgical abortion that was uncomplicated.  Pt developed pelvic pain , bleeding and fever.  Pt was admitted for IV antibiotics.  Pt improved throughout the week.  She still had some cramping and an ultrasound suggested the potential for retained POCs.  Ultrasound guided dilation and curettage was performed with minimal return.  Pt did well and was discharged that day.  Consults: None  Significant Diagnostic Studies: radiology: Ultrasound: pelvic ultrasound and guidance during D and C  Treatments: antibiotics: Zosyn  and doxycycline  and surgery: ultrasound guided suction D and C  Discharge Exam: Blood pressure (!) 103/59, pulse 61, temperature 98.3 F (36.8 C), temperature source Oral, resp. rate 16, height 5\' 7"  (1.702 m), weight 57.6 kg, last menstrual period 09/04/2023, SpO2 99%. General appearance: alert, cooperative, and no distress Head: Normocephalic, without obvious abnormality, atraumatic Resp: clear to auscultation bilaterally Cardio: regular rate and rhythm GI: soft, non-tender; bowel sounds normal; no masses,  no organomegaly  Disposition: Discharge disposition: 01-Home or Self Care       Discharge Instructions     Call MD for:  difficulty breathing, headache or visual disturbances   Complete by: As directed    Call MD for:  persistant dizziness or light-headedness   Complete by: As directed    Call MD for:  persistant nausea and vomiting   Complete by: As directed    Call MD for:  severe uncontrolled  pain   Complete by: As directed    Call MD for:  temperature >100.4   Complete by: As directed    Diet general   Complete by: As directed    Increase activity slowly   Complete by: As directed    Sexual Activity Restrictions   Complete by: As directed    Pelvic rest 4-5 weeks      Allergies as of 10/30/2023   No Known Allergies      Medication List     STOP taking these medications    benzonatate  100 MG capsule Commonly known as: TESSALON    promethazine  12.5 MG tablet Commonly known as: PHENERGAN        TAKE these medications    doxycycline  100 MG tablet Commonly known as: VIBRA -TABS Take 1 tablet (100 mg total) by mouth every 12 (twelve) hours for 5 days.   etonogestrel -ethinyl estradiol  0.12-0.015 MG/24HR vaginal ring Commonly known as: NUVARING Insert vaginally and leave in place for 4 consecutive weeks, then swtich   ibuprofen  600 MG tablet Commonly known as: ADVIL  Take 1 tablet (600 mg total) by mouth every 6 (six) hours as needed for mild pain (pain score 1-3), moderate pain (pain score 4-6), cramping or fever (1st line). What changed:  when to take this reasons to take this        Follow-up Information     [facility]. Schedule an appointment as soon as possible for a visit in 1 week(s).   Specialty: Obstetrics and Gynecology Why: post op 2-3 weeks Contact information: [address] [place]  [address] [phone]  Signed: Abigail Abler 10/30/2023, 1:48 PM

## 2023-10-31 ENCOUNTER — Encounter (HOSPITAL_COMMUNITY): Payer: Self-pay | Admitting: Obstetrics and Gynecology

## 2023-10-31 LAB — CULTURE, BLOOD (ROUTINE X 2)
Culture: NO GROWTH
Culture: NO GROWTH
Special Requests: ADEQUATE
Special Requests: ADEQUATE

## 2023-11-02 LAB — SURGICAL PATHOLOGY

## 2023-11-06 ENCOUNTER — Encounter

## 2023-12-18 ENCOUNTER — Telehealth: Admitting: Nurse Practitioner

## 2023-12-18 DIAGNOSIS — F40243 Fear of flying: Secondary | ICD-10-CM

## 2023-12-18 NOTE — Patient Instructions (Signed)
    For an urgent face to face visit,  has multiple urgent care centers for your convenience.  Click the link below for the full list of locations and hours, walk-in wait times, appointment scheduling options and driving directions:  Urgent Care - [place], [place], [place], [place], [place], [place]  [facility]

## 2023-12-18 NOTE — Progress Notes (Signed)
 Virtual Visit Consent   Caroline Lynch, you are scheduled for a virtual visit with a [facility] provider today. Just as with appointments in the office, your consent must be obtained to participate. Your consent will be active for this visit and any virtual visit you may have with one of our providers in the next 365 days. If you have a MyChart account, a copy of this consent can be sent to you electronically.  As this is a virtual visit, video technology does not allow for your provider to perform a traditional examination. This may limit your provider's ability to fully assess your condition. If your provider identifies any concerns that need to be evaluated in person or the need to arrange testing (such as labs, EKG, etc.), we will make arrangements to do so. Although advances in technology are sophisticated, we cannot ensure that it will always work on either your end or our end. If the connection with a video visit is poor, the visit may have to be switched to a telephone visit. With either a video or telephone visit, we are not always able to ensure that we have a secure connection.  By engaging in this virtual visit, you consent to the provision of healthcare and authorize for your insurance to be billed (if applicable) for the services provided during this visit. Depending on your insurance coverage, you may receive a charge related to this service.  I need to obtain your verbal consent now. Are you willing to proceed with your visit today? Caroline Lynch has provided verbal consent on 12/18/2023 for a virtual visit (video or telephone). Mardene Shake, FNP  Date: 12/18/2023 3:30 PM   Virtual Visit via Video Note   I, Mardene Shake, connected with  Caroline Lynch  ([id number], 1996/04/25) on 12/18/23 at  3:30 PM EDT by a video-enabled telemedicine application and verified that I am speaking with the correct person using two identifiers.  Location: Patient: Virtual Visit Location Patient:  Home Provider: Virtual Visit Location Provider: Home Office   I discussed the limitations of evaluation and management by telemedicine and the availability of in person appointments. The patient expressed understanding and agreed to proceed.    History of Present Illness: Caroline Lynch is a 28 y.o. 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   Her concern is heightened due to recent events in the news    She is suppose to fly tomorrow and is seeking assistance with medicine for flying.  She has tried hydroxyzine in the past and that has not helped her   Problems:  Patient Active Problem List   Diagnosis Date Noted   Endometrioma of both ovaries 10/29/2023   AKI (acute kidney injury) (HCC) 10/29/2023   Abortion with septicemia 10/26/2023   Sepsis following complete or unspecified spontaneous abortion 10/26/2023    Allergies: No Known Allergies Medications:  Current Outpatient Medications:    etonogestrel -ethinyl estradiol  (NUVARING) 0.12-0.015 MG/24HR vaginal ring, Insert vaginally and leave in place for 4 consecutive weeks, then swtich, Disp: 1 each, Rfl: 11   ibuprofen  (ADVIL ) 600 MG tablet, Take 1 tablet (600 mg total) by mouth every 6 (six) hours as needed for mild pain (pain score 1-3), moderate pain (pain score 4-6), cramping or fever (1st line)., Disp: 30 tablet, Rfl: 0  Observations/Objective: Patient is well-developed, well-nourished in no acute distress.  Resting comfortably  at home.  Head is normocephalic, atraumatic.  No labored  breathing.  Speech is clear and coherent with logical content.  Patient is alert and oriented at baseline.    Assessment and Plan:  1. Anxiety with flying  Offered hydroxyzine but patient states that has not helped her  Discussed that Virtual care cannot prescribe narcotic/controlled medications and for that assistance she would need to be seen in person   Given  information for in person locations that have appointments available tonight   Follow Up Instructions: I discussed the assessment and treatment plan with the patient. The patient was provided an opportunity to ask questions and all were answered. The patient agreed with the plan and demonstrated an understanding of the instructions.  A copy of instructions were sent to the patient via MyChart unless otherwise noted below.    The patient was advised to call back or seek an in-person evaluation if the symptoms worsen or if the condition fails to improve as anticipated.    Mardene Shake, FNP

## 2023-12-25 ENCOUNTER — Ambulatory Visit: Admitting: Nurse Practitioner

## 2024-01-06 ENCOUNTER — Ambulatory Visit
Admission: RE | Admit: 2024-01-06 | Discharge: 2024-01-06 | Disposition: A | Discharge status: Home / Self Care | Source: Ambulatory Visit | Attending: Family Medicine | Admitting: Family Medicine

## 2024-01-06 VITALS — BP 139/91 | HR 87 | Temp 98.2°F | Resp 17

## 2024-01-06 DIAGNOSIS — J029 Acute pharyngitis, unspecified: Secondary | ICD-10-CM | POA: Insufficient documentation

## 2024-01-06 LAB — POCT RAPID STREP A (OFFICE): Rapid Strep A Screen: NEGATIVE

## 2024-01-06 NOTE — ED Triage Notes (Signed)
 Pt present with a sore throat x one day. C/o neck pain and swollen lymph nodes.

## 2024-01-06 NOTE — Discharge Instructions (Signed)
 The clinic will contact you with the results of that throat culture done today if positive.  We do salt gargles and warm liquids such as teas and honey.  Lots of rest and fluids.  Follow-up with your PCP if your symptoms do not improve.  Please go to the ER for any worsening symptoms.  Hope you feel better soon!

## 2024-01-06 NOTE — ED Provider Notes (Signed)
 [facility]    CSN: [id number] Arrival date & time: 01/06/24  1346      History   Chief Complaint Chief Complaint  Patient presents with   Sore Throat    Entered by patient    HPI Caroline Lynch is a 28 y.o. female  presents for evaluation of URI symptoms for 1 days. Patient reports associated symptoms of sore throat, fever of 99 degrees, swollen lymph nodes. Denies N/V/D, cough, congestion, ear pain, body aches, shortness of breath. Patient does not have a hx of asthma. Patient is not an active smoker.   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  Pt has taken nothing OTC for symptoms. Pt has no other concerns at this time.    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   AKI (acute kidney injury) (HCC) 10/29/2023   Abortion with septicemia 10/26/2023   Sepsis following complete or unspecified spontaneous abortion 10/26/2023    Past Surgical History:  Procedure Laterality Date   DIAGNOSTIC LARYNGOSCOPY  11/07/2021   Procedure: DIAGNOSTIC LARYNGOSCOPY, lysis of adhesions, peritoneal wall biopsy;  Surgeon: Fredirick Glenys RAMAN, MD;  Location: [facility] OR;  Service: Gynecology;;   DILATION AND CURETTAGE OF UTERUS N/A 10/30/2023   Procedure: ULTRASOUND GUIDED DILATION AND EVACUATION;  Surgeon: Zina Jerilynn LABOR, MD;  Location: [facility] OR;  Service: Gynecology;  Laterality: N/A;   THERAPEUTIC ABORTION      OB History   No obstetric history on file.      Home Medications    Prior to Admission medications   Medication Sig Start Date End Date Taking? Authorizing Provider  etonogestrel -ethinyl estradiol  (NUVARING) 0.12-0.015 MG/24HR vaginal ring Insert vaginally and leave in place for 4 consecutive weeks, then swtich 08/31/23   Ajewole, Christana, MD  ibuprofen  (ADVIL ) 600 MG tablet Take 1 tablet (600 mg total) by mouth every 6 (six) hours as needed for  mild pain (pain score 1-3), moderate pain (pain score 4-6), cramping or fever (1st line). 10/30/23   Zina Jerilynn LABOR, MD    Family History History reviewed. No pertinent family history.  Social History Social History   Tobacco Use   Smoking status: Some Days    Types: Cigarettes, E-cigarettes   Smokeless tobacco: Never  Vaping Use   Vaping status: Some Days  Substance Use Topics   Alcohol use: Yes    Comment: occasional   Drug use: Yes    Types: Marijuana    Comment: occasional     Allergies   Patient has no known allergies.   Review of Systems Review of Systems  HENT:  Positive for sore throat.      Physical Exam Triage Vital Signs ED Triage Vitals  Encounter Vitals Group     BP 01/06/24 1351 (!) 139/91     Girls Systolic BP Percentile --      Girls Diastolic BP Percentile --      Boys Systolic BP Percentile --      Boys Diastolic BP Percentile --      Pulse Rate 01/06/24 1351 87     Resp 01/06/24 1351 17     Temp 01/06/24 1357 98.2 F (36.8 C)     Temp src --      SpO2 01/06/24 1351 98 %     Weight --      Height --  Head Circumference --      Peak Flow --      Pain Score 01/06/24 1350 8     Pain Loc --      Pain Education --      Exclude from Growth Chart --    No data found.  Updated Vital Signs BP (!) 139/91 (BP Location: Left Arm)   Pulse 87   Temp 98.2 F (36.8 C)   Resp 17   LMP 12/29/2023 (Exact Date)   SpO2 98%   Visual Acuity Right Eye Distance:   Left Eye Distance:   Bilateral Distance:    Right Eye Near:   Left Eye Near:    Bilateral Near:     Physical Exam Vitals and nursing note reviewed.  Constitutional:      General: She is not in acute distress.    Appearance: She is well-developed. She is not ill-appearing.  HENT:     Head: Normocephalic and atraumatic.     Right Ear: Tympanic membrane and ear canal normal.     Left Ear: Tympanic membrane and ear canal normal.     Nose: No congestion or rhinorrhea.      Mouth/Throat:     Mouth: Mucous membranes are moist.     Pharynx: Oropharynx is clear. Uvula midline. Posterior oropharyngeal erythema present.     Tonsils: No tonsillar exudate or tonsillar abscesses.  Eyes:     Conjunctiva/sclera: Conjunctivae normal.     Pupils: Pupils are equal, round, and reactive to light.  Cardiovascular:     Rate and Rhythm: Normal rate and regular rhythm.     Heart sounds: Normal heart sounds.  Pulmonary:     Effort: Pulmonary effort is normal.     Breath sounds: Normal breath sounds.  Musculoskeletal:     Cervical back: Normal range of motion and neck supple.  Lymphadenopathy:     Cervical: No cervical adenopathy.  Skin:    General: Skin is warm and dry.  Neurological:     General: No focal deficit present.     Mental Status: She is alert and oriented to person, place, and time.  Psychiatric:        Mood and Affect: Mood normal.        Behavior: Behavior normal.      UC Treatments / Results  Labs (all labs ordered are listed, but only abnormal results are displayed) Labs Reviewed  CULTURE, GROUP A STREP [facility])  POCT RAPID STREP A (OFFICE)    EKG   Radiology No results found.  Procedures Procedures (including critical care time)  Medications Ordered in UC Medications - No data to display  Initial Impression / Assessment and Plan / UC Course  I have reviewed the triage vital signs and the nursing notes.  Pertinent labs & imaging results that were available during my care of the patient were reviewed by me and considered in my medical decision making (see chart for details).     Neg rapid strep for culture.  Discussed viral illness and symptomatic treatment.  PCP follow-up if symptoms do not improve.  ER precautions reviewed. Final Clinical Impressions(s) / UC Diagnoses   Final diagnoses:  Sore throat  Acute pharyngitis, unspecified etiology     Discharge Instructions      The clinic will contact you with the results of that  throat culture done today if positive.  We do salt gargles and warm liquids such as teas and honey.  Lots of rest and fluids.  Follow-up with your PCP if your symptoms do not improve.  Please go to the ER for any worsening symptoms.  Hope you feel better soon!    ED Prescriptions   None    PDMP not reviewed this encounter.   Loreda Myla SAUNDERS, NP 01/06/24 1414

## 2024-01-09 LAB — CULTURE, GROUP A STREP (THRC)

## 2024-01-10 ENCOUNTER — Ambulatory Visit
Admission: EM | Admit: 2024-01-10 | Discharge: 2024-01-10 | Disposition: A | Discharge status: Home / Self Care | Attending: Family Medicine | Admitting: Family Medicine

## 2024-01-10 ENCOUNTER — Ambulatory Visit: Payer: Self-pay | Admitting: Urgent Care

## 2024-01-10 ENCOUNTER — Ambulatory Visit (INDEPENDENT_AMBULATORY_CARE_PROVIDER_SITE_OTHER)

## 2024-01-10 DIAGNOSIS — B9689 Other specified bacterial agents as the cause of diseases classified elsewhere: Secondary | ICD-10-CM

## 2024-01-10 DIAGNOSIS — J069 Acute upper respiratory infection, unspecified: Secondary | ICD-10-CM | POA: Diagnosis not present

## 2024-01-10 DIAGNOSIS — R0602 Shortness of breath: Secondary | ICD-10-CM | POA: Diagnosis not present

## 2024-01-10 MED ORDER — AMOXICILLIN-POT CLAVULANATE 875-125 MG PO TABS: 1.0000 | ORAL_TABLET | Freq: Two times a day (BID) | ORAL | 0 refills | Status: DC

## 2024-01-10 MED ORDER — CETIRIZINE HCL 10 MG PO TABS: 10.0000 mg | ORAL_TABLET | Freq: Every day | ORAL | 0 refills | Status: DC

## 2024-01-10 MED ORDER — TOBRAMYCIN 0.3 % OP SOLN: 1.0000 [drp] | OPHTHALMIC | 0 refills | Status: DC

## 2024-01-10 MED ORDER — IBUPROFEN 800 MG PO TABS
800.0000 mg | ORAL_TABLET | Freq: Once | ORAL | Status: AC
  Administered 2024-01-10: 800 mg via ORAL

## 2024-01-10 MED ORDER — PROMETHAZINE-DM 6.25-15 MG/5ML PO SYRP: 5.0000 mL | ORAL_SOLUTION | Freq: Three times a day (TID) | ORAL | 0 refills | Status: DC | PRN

## 2024-01-10 MED ORDER — PSEUDOEPHEDRINE HCL 30 MG PO TABS: 30.0000 mg | ORAL_TABLET | Freq: Three times a day (TID) | ORAL | 0 refills | Status: DC | PRN

## 2024-01-10 NOTE — ED Triage Notes (Addendum)
 Pt c/o SHOB, fatigue, head/chest congestion, fever x 7 days-seen here 728/ for c/o-woke this am hoarse and drainage from both eyes-last tylenol  11am-NAD-steady gait

## 2024-01-10 NOTE — ED Provider Notes (Signed)
 [facility]  Note:  This document was prepared using Conservation officer, historic buildings and may include unintentional dictation errors.  MRN: [id number] DOB: 09/10/1995  Subjective:   Caroline Lynch is a 28 y.o. female presenting for 1 week history of acute onset persistent and worsening sinus congestion, sinus pain, fatigue, shortness of breath, coughing, hoarseness.  Now she has had redness and drainage from both eyes since yesterday.  No history of asthma.  However, she is a smoker.  No current facility-administered medications for this encounter.  Current Outpatient Medications:    etonogestrel -ethinyl estradiol  (NUVARING) 0.12-0.015 MG/24HR vaginal ring, Insert vaginally and leave in place for 4 consecutive weeks, then swtich, Disp: 1 each, Rfl: 11   ibuprofen  (ADVIL ) 600 MG tablet, Take 1 tablet (600 mg total) by mouth every 6 (six) hours as needed for mild pain (pain score 1-3), moderate pain (pain score 4-6), cramping or fever (1st line)., Disp: 30 tablet, Rfl: 0   No Known Allergies  Past Medical History:  Diagnosis Date   Endometriosis    Ovarian cyst      Past Surgical History:  Procedure Laterality Date   DIAGNOSTIC LARYNGOSCOPY  11/07/2021   Procedure: DIAGNOSTIC LARYNGOSCOPY, lysis of adhesions, peritoneal wall biopsy;  Surgeon: Fredirick Glenys RAMAN, MD;  Location: [facility] OR;  Service: Gynecology;;   DILATION AND CURETTAGE OF UTERUS N/A 10/30/2023   Procedure: ULTRASOUND GUIDED DILATION AND EVACUATION;  Surgeon: Zina Jerilynn LABOR, MD;  Location: [facility] OR;  Service: Gynecology;  Laterality: N/A;   THERAPEUTIC ABORTION      No family history on file.  Social History   Tobacco Use   Smoking status: Some Days    Types: Cigarettes, E-cigarettes   Smokeless tobacco: Never  Vaping Use   Vaping status: Some Days  Substance Use Topics   Alcohol use: Yes    Comment: occasional   Drug use: Yes    Types: Marijuana    Comment: occasional     ROS   Objective:   Vitals: BP (!) (P) 152/91 (BP Location: Right Arm)   Pulse (P) 97   Temp (!) (P) 101.2 F (38.4 C) (Oral)   Resp (P) 20   LMP 12/29/2023 (Exact Date)   SpO2 (P) 98%   Physical Exam Constitutional:      General: She is not in acute distress.    Appearance: Normal appearance. She is well-developed and normal weight. She is not ill-appearing, toxic-appearing or diaphoretic.  HENT:     Head: Normocephalic and atraumatic.     Right Ear: Tympanic membrane, ear canal and external ear normal. No drainage or tenderness. No middle ear effusion. There is no impacted cerumen. Tympanic membrane is not erythematous or bulging.     Left Ear: Tympanic membrane, ear canal and external ear normal. No drainage or tenderness.  No middle ear effusion. There is no impacted cerumen. Tympanic membrane is not erythematous or bulging.     Nose: Congestion present. No rhinorrhea.     Mouth/Throat:     Mouth: Mucous membranes are moist. No oral lesions.     Pharynx: Posterior oropharyngeal erythema present. No pharyngeal swelling, oropharyngeal exudate or uvula swelling.     Tonsils: No tonsillar exudate or tonsillar abscesses.  Eyes:     General: Lids are normal. Lids are everted, no foreign bodies appreciated. Vision grossly intact. No scleral icterus.       Right eye: No foreign body, discharge or hordeolum.  Left eye: No foreign body, discharge or hordeolum.     Extraocular Movements: Extraocular movements intact.     Right eye: Normal extraocular motion.     Left eye: Normal extraocular motion and no nystagmus.     Conjunctiva/sclera:     Right eye: Right conjunctiva is injected. No chemosis, exudate or hemorrhage.    Left eye: Left conjunctiva is injected. No chemosis, exudate or hemorrhage.    Pupils: Pupils are equal, round, and reactive to light.  Cardiovascular:     Rate and Rhythm: Normal rate and regular rhythm.     Heart sounds: Normal heart sounds. No murmur  heard.    No friction rub. No gallop.  Pulmonary:     Effort: Pulmonary effort is normal. No respiratory distress.     Breath sounds: No stridor. No wheezing, rhonchi or rales.  Chest:     Chest wall: No tenderness.  Musculoskeletal:     Cervical back: Normal range of motion and neck supple.  Lymphadenopathy:     Cervical: No cervical adenopathy.  Skin:    General: Skin is warm and dry.  Neurological:     General: No focal deficit present.     Mental Status: She is alert and oriented to person, place, and time.  Psychiatric:        Mood and Affect: Mood normal.        Behavior: Behavior normal.     Assessment and Plan :   PDMP not reviewed this encounter.  1. Bacterial upper respiratory infection   2. Shortness of breath    Patient given ibuprofen  in clinic for fever.  Recommend managing for bacterial upper respiratory infection secondary to conjunctivitis.  Will start Augmentin , tobramycin .  Recommend general supportive care.  X-ray over-read was pending at time of discharge, recommended follow up with only abnormal results. Otherwise will not call for negative over-read. Patient was in agreement.  Counseled patient on potential for adverse effects with medications prescribed/recommended today, ER and return-to-clinic precautions discussed, patient verbalized understanding.    Christopher Savannah, [place] 01/10/24 1302

## 2024-01-10 NOTE — Discharge Instructions (Addendum)
 We will manage this as a bacterial upper respiratory infection with amoxicillin -clavulanate. For sore throat or cough try using a honey-based tea. Use 3 teaspoons of honey with juice squeezed from half lemon. Place shaved pieces of ginger into 1/2-1 cup of water and warm over stove top. Then mix the ingredients and repeat every 4 hours as needed. Please take ibuprofen 600mg  every 6 hours with food alternating with OR taken together with Tylenol 500mg -650mg  every 6 hours for throat pain, fevers, aches and pains. Hydrate very well with at least 2 liters of water. Eat light meals such as soups (chicken and noodles, vegetable, chicken and wild rice).  Do not eat foods that you are allergic to.  Taking an antihistamine like Zyrtec  can help against postnasal drainage, sinus congestion which can cause sinus pain, sinus headaches, throat pain, painful swallowing, coughing.  You can take this together with pseudoephedrine  (Sudafed) at a dose of 30 mg 3 times a day or twice daily as needed for the same kind of nasal drip, congestion.  Use cough medication as needed.

## 2024-01-11 ENCOUNTER — Ambulatory Visit (HOSPITAL_COMMUNITY): Payer: Self-pay

## 2024-01-27 ENCOUNTER — Ambulatory Visit
Admission: EM | Admit: 2024-01-27 | Discharge: 2024-01-27 | Disposition: A | Discharge status: Home / Self Care | Attending: Family Medicine | Admitting: Family Medicine

## 2024-01-27 DIAGNOSIS — R509 Fever, unspecified: Secondary | ICD-10-CM | POA: Insufficient documentation

## 2024-01-27 DIAGNOSIS — R070 Pain in throat: Secondary | ICD-10-CM | POA: Insufficient documentation

## 2024-01-27 LAB — POCT RAPID STREP A (OFFICE): Rapid Strep A Screen: NEGATIVE

## 2024-01-27 LAB — POC COVID19/FLU A&B COMBO
Covid Antigen, POC: NEGATIVE
Influenza A Antigen, POC: NEGATIVE
Influenza B Antigen, POC: NEGATIVE

## 2024-01-27 MED ORDER — ACETAMINOPHEN 325 MG PO TABS
975.0000 mg | ORAL_TABLET | Freq: Once | ORAL | Status: AC
  Administered 2024-01-27: 975 mg via ORAL

## 2024-01-27 MED ORDER — IBUPROFEN 800 MG PO TABS
800.0000 mg | ORAL_TABLET | Freq: Once | ORAL | Status: AC
  Administered 2024-01-27: 800 mg via ORAL

## 2024-01-27 NOTE — ED Provider Notes (Signed)
 [facility]  Note:  This document was prepared using Conservation officer, historic buildings and may include unintentional dictation errors.  MRN: [id number] DOB: [birth year]  Subjective:   Caroline Lynch is a 28 y.o. female presenting for 4-day history of recurrent throat pain, persistent fevers.  Patient was last seen 01/10/2024.  She was seen 01/06/2024 as well and tested negative for strep including the culture.  At my visit with her on the sixth I deferred repeat testing and treated empirically for a bacterial upper respiratory infection and conjunctivitis with Augmentin  which she finished 5 days ago.  Chest x-ray was negative.  Patient felt well while on the antibiotic.  Just a few days after finishing the antibiotic, she had recurrent fevers and throat pain.  She has used ibuprofen  with minimal relief.  Denies sinus symptoms, cough, chest pain, shortness of breath or wheezing, body pains, headaches, eye irritation.  No current facility-administered medications for this encounter.  Current Outpatient Medications:    amoxicillin -clavulanate (AUGMENTIN ) 875-125 MG tablet, Take 1 tablet by mouth 2 (two) times daily., Disp: 20 tablet, Rfl: 0   cetirizine  (ZYRTEC  ALLERGY) 10 MG tablet, Take 1 tablet (10 mg total) by mouth daily., Disp: 30 tablet, Rfl: 0   etonogestrel -ethinyl estradiol  (NUVARING) 0.12-0.015 MG/24HR vaginal ring, Insert vaginally and leave in place for 4 consecutive weeks, then swtich, Disp: 1 each, Rfl: 11   ibuprofen  (ADVIL ) 600 MG tablet, Take 1 tablet (600 mg total) by mouth every 6 (six) hours as needed for mild pain (pain score 1-3), moderate pain (pain score 4-6), cramping or fever (1st line)., Disp: 30 tablet, Rfl: 0   promethazine -dextromethorphan (PROMETHAZINE -DM) 6.25-15 MG/5ML syrup, Take 5 mLs by mouth 3 (three) times daily as needed for cough., Disp: 200 mL, Rfl: 0   pseudoephedrine  (SUDAFED) 30 MG tablet, Take 1 tablet (30 mg total) by mouth every 8  (eight) hours as needed for congestion., Disp: 30 tablet, Rfl: 0   tobramycin  (TOBREX ) 0.3 % ophthalmic solution, Place 1 drop into both eyes every 4 (four) hours., Disp: 5 mL, Rfl: 0   No Known Allergies  Past Medical History:  Diagnosis Date   Endometriosis    Ovarian cyst      Past Surgical History:  Procedure Laterality Date   DIAGNOSTIC LARYNGOSCOPY  11/07/2021   Procedure: DIAGNOSTIC LARYNGOSCOPY, lysis of adhesions, peritoneal wall biopsy;  Surgeon: Fredirick Glenys RAMAN, MD;  Location: [facility] OR;  Service: Gynecology;;   DILATION AND CURETTAGE OF UTERUS N/A 10/30/2023   Procedure: ULTRASOUND GUIDED DILATION AND EVACUATION;  Surgeon: Zina Jerilynn LABOR, MD;  Location: [facility] OR;  Service: Gynecology;  Laterality: N/A;   THERAPEUTIC ABORTION      History reviewed. No pertinent family history.  Social History   Tobacco Use   Smoking status: Some Days    Types: Cigarettes, E-cigarettes   Smokeless tobacco: Never  Vaping Use   Vaping status: Some Days  Substance Use Topics   Alcohol use: Yes    Comment: occasional   Drug use: Yes    Types: Marijuana    Comment: occasional    ROS   Objective:   Vitals: BP (!) 141/96 (BP Location: Right Arm)   Pulse (!) 118   Temp (!) 102.8 F (39.3 C) (Oral)   Resp 16   LMP 12/29/2023 (Exact Date)   SpO2 98%   Physical Exam Constitutional:      General: She is not in acute distress.    Appearance: Normal appearance. She is  well-developed. She is not ill-appearing, toxic-appearing or diaphoretic.  HENT:     Head: Normocephalic and atraumatic.     Nose: Nose normal.     Mouth/Throat:     Mouth: Mucous membranes are moist.     Pharynx: No pharyngeal swelling, oropharyngeal exudate, posterior oropharyngeal erythema or uvula swelling.     Tonsils: No tonsillar exudate or tonsillar abscesses. 0 on the right. 0 on the left.  Eyes:     General: No scleral icterus.       Right eye: No discharge.        Left eye: No discharge.     Extraocular  Movements: Extraocular movements intact.  Cardiovascular:     Rate and Rhythm: Normal rate and regular rhythm.     Heart sounds: Normal heart sounds. No murmur heard.    No friction rub. No gallop.  Pulmonary:     Effort: Pulmonary effort is normal. No respiratory distress.     Breath sounds: No stridor. No wheezing, rhonchi or rales.  Chest:     Chest wall: No tenderness.  Skin:    General: Skin is warm and dry.  Neurological:     General: No focal deficit present.     Mental Status: She is alert and oriented to person, place, and time.  Psychiatric:        Mood and Affect: Mood normal.        Behavior: Behavior normal.     Results for orders placed or performed during the hospital encounter of 01/27/24 (from the past 24 hours)  POCT rapid strep A     Status: None   Collection Time: 01/27/24  1:33 PM  Result Value Ref Range   Rapid Strep A Screen Negative Negative  POC Covid19/Flu A&B Antigen     Status: None   Collection Time: 01/27/24  1:57 PM  Result Value Ref Range   Influenza A Antigen, POC Negative Negative   Influenza B Antigen, POC Negative Negative   Covid Antigen, POC Negative Negative   Fever improved to 101.5 F an hour after a max dose of Tylenol  at 975 mg.  An additional 800 mg ibuprofen  was dosed.  Assessment and Plan :   PDMP not reviewed this encounter.  1. Fever, unspecified   2. Throat pain    Fever of unknown etiology.  Labs pending, recommend continued supportive care.  Patient declined pregnancy test.  Maintain strict ER precautions.  Counseled patient on potential for adverse effects with medications prescribed today, patient verbalized understanding.    Christopher Savannah, [place] 01/27/24 1544

## 2024-01-27 NOTE — ED Triage Notes (Signed)
 Pt reports fever and sore throat x 4 days. Pt is concern as she had an septic abortion April 2025. Ibuprofen  gives some relief.

## 2024-01-28 ENCOUNTER — Ambulatory Visit: Payer: Self-pay | Admitting: Urgent Care

## 2024-01-28 LAB — CBC WITH DIFFERENTIAL/PLATELET
Basophils Absolute: 0 x10E3/uL (ref 0.0–0.2)
Basos: 0 %
EOS (ABSOLUTE): 0.1 x10E3/uL (ref 0.0–0.4)
Eos: 1 %
Hematocrit: 35.4 % (ref 34.0–46.6)
Hemoglobin: 11.3 g/dL (ref 11.1–15.9)
Immature Grans (Abs): 0 x10E3/uL (ref 0.0–0.1)
Immature Granulocytes: 0 %
Lymphocytes Absolute: 0.7 x10E3/uL (ref 0.7–3.1)
Lymphs: 7 %
MCH: 29 pg (ref 26.6–33.0)
MCHC: 31.9 g/dL (ref 31.5–35.7)
MCV: 91 fL (ref 79–97)
Monocytes Absolute: 0.7 x10E3/uL (ref 0.1–0.9)
Monocytes: 6 %
Neutrophils Absolute: 8.9 x10E3/uL — ABNORMAL HIGH (ref 1.4–7.0)
Neutrophils: 86 %
Platelets: 241 x10E3/uL (ref 150–450)
RBC: 3.89 x10E6/uL (ref 3.77–5.28)
RDW: 14.5 % (ref 11.7–15.4)
WBC: 10.4 x10E3/uL (ref 3.4–10.8)

## 2024-01-28 LAB — COMPREHENSIVE METABOLIC PANEL WITH GFR
ALT: 7 IU/L (ref 0–32)
AST: 18 IU/L (ref 0–40)
Albumin: 4.4 g/dL (ref 4.0–5.0)
Alkaline Phosphatase: 56 IU/L (ref 44–121)
BUN/Creatinine Ratio: 12 (ref 9–23)
BUN: 10 mg/dL (ref 6–20)
Bilirubin Total: 0.4 mg/dL (ref 0.0–1.2)
CO2: 19 mmol/L — ABNORMAL LOW (ref 20–29)
Calcium: 9.3 mg/dL (ref 8.7–10.2)
Chloride: 102 mmol/L (ref 96–106)
Creatinine, Ser: 0.81 mg/dL (ref 0.57–1.00)
Globulin, Total: 3.1 g/dL (ref 1.5–4.5)
Glucose: 92 mg/dL (ref 70–99)
Potassium: 4 mmol/L (ref 3.5–5.2)
Sodium: 137 mmol/L (ref 134–144)
Total Protein: 7.5 g/dL (ref 6.0–8.5)
eGFR: 103 mL/min/1.73 (ref >59)

## 2024-01-28 LAB — CYTOLOGY, (ORAL, ANAL, URETHRAL) ANCILLARY ONLY
Chlamydia: NEGATIVE
Comment: NEGATIVE
Comment: NORMAL
Neisseria Gonorrhea: NEGATIVE

## 2024-01-28 LAB — HIV ANTIBODY (ROUTINE TESTING W REFLEX): HIV Screen 4th Generation wRfx: NONREACTIVE

## 2024-01-28 LAB — RPR: RPR Ser Ql: NONREACTIVE

## 2024-01-28 MED ORDER — CLINDAMYCIN HCL 300 MG PO CAPS: 300.0000 mg | ORAL_CAPSULE | Freq: Three times a day (TID) | ORAL | 0 refills | Status: DC

## 2024-01-28 MED ORDER — FLUCONAZOLE 150 MG PO TABS: 150.0000 mg | ORAL_TABLET | ORAL | 0 refills | Status: DC

## 2024-02-15 ENCOUNTER — Ambulatory Visit: Payer: Self-pay | Admitting: Nurse Practitioner

## 2024-02-19 ENCOUNTER — Ambulatory Visit: Payer: Self-pay | Admitting: Nurse Practitioner

## 2024-02-19 VITALS — BP 122/75 | HR 76 | Temp 98.1°F | Wt 131.0 lb

## 2024-02-19 DIAGNOSIS — F32A Depression, unspecified: Secondary | ICD-10-CM | POA: Diagnosis not present

## 2024-02-19 DIAGNOSIS — F419 Anxiety disorder, unspecified: Secondary | ICD-10-CM | POA: Diagnosis not present

## 2024-02-19 DIAGNOSIS — G4700 Insomnia, unspecified: Secondary | ICD-10-CM | POA: Insufficient documentation

## 2024-02-19 DIAGNOSIS — Z720 Tobacco use: Secondary | ICD-10-CM | POA: Insufficient documentation

## 2024-02-19 DIAGNOSIS — N80123 Deep endometriosis of bilateral ovaries: Secondary | ICD-10-CM

## 2024-02-19 MED ORDER — DULOXETINE HCL 30 MG PO CPEP: 30.0000 mg | ORAL_CAPSULE | Freq: Every day | ORAL | 3 refills | Status: DC

## 2024-02-19 MED ORDER — HYDROXYZINE PAMOATE 25 MG PO CAPS: 25.0000 mg | ORAL_CAPSULE | Freq: Three times a day (TID) | ORAL | 0 refills | Status: DC | PRN

## 2024-02-19 NOTE — Assessment & Plan Note (Signed)
  Chronic insomnia with difficulty falling asleep and frequent awakenings, exacerbated by anxiety. Hydroxyzine  previously effective. - Prescribe hydroxyzine  as needed for sleep and anxiety.

## 2024-02-19 NOTE — Assessment & Plan Note (Addendum)
    02/19/2024   12:17 PM  GAD 7 : Generalized Anxiety Score  Nervous, Anxious, on Edge 3  Control/stop worrying 3  Worry too much - different things 3  Trouble relaxing 3  Restless 1  Easily annoyed or irritable 1  Afraid - awful might happen 3  Total GAD 7 Score 17  Anxiety Difficulty Somewhat difficult        02/19/2024   11:17 AM  Depression screen PHQ 2/9  Decreased Interest 1  Down, Depressed, Hopeless 1  PHQ - 2 Score 2  Altered sleeping 3  Tired, decreased energy 2  Change in appetite 1  Feeling bad or failure about yourself  0  Trouble concentrating 1  Moving slowly or fidgety/restless 0  Suicidal thoughts 0  PHQ-9 Score 9  Difficult doing work/chores Somewhat difficult   Chronic anxiety and depression with insomnia. Previous sertraline caused emotional blunting and decreased libido. No suicidal ideation. Therapy interest limited by insurance. Duloxetine  considered for lower sexual dysfunction risk. - Prescribe duloxetine  30 mg tablet daily. Hydroxyzine  25 mg 3 times daily as needed - Refer to a therapist within the [facility]. She denies SI, HI follow-up in 4 weeks

## 2024-02-19 NOTE — Progress Notes (Signed)
 Acute Office Visit  Subjective:     Patient ID: Caroline Lynch, female    DOB: 10-Oct-1995, 28 y.o.   MRN: [id number]  Chief Complaint  Patient presents with   Establish Care    HPI Patient is in today for    Discussed the use of AI scribe software for clinical note transcription with the patient, who gave verbal consent to proceed.  History of Present Illness Caroline Lynch is a 28 year old female  has a past medical history of Endometriosis and Ovarian cyst. who presents to establish care for her chronic medical conditions.  Has no previous PCP  She has a history of endometriosis and reports experiencing extreme pelvic pain. She underwent surgery in 2023 and a dilation and curettage (D&C) procedure this year. She was previously prescribed birth control but discontinued it due to exacerbation of anxiety, depression, and decreased libido.  She has been experiencing insomnia for approximately two years, typically getting about six hours of sleep per night. She has difficulty falling asleep due to anxiety and frequently wakes up during the night. She has tried hydroxyzine  in the past and found it helpful for sleep and anxiety.  She has a long-standing history of anxiety and depression. She previously tried sertraline but discontinued it due to side effects including emotional numbness and decreased sex drive. She has not seen a therapist but wants to do so.  Her family history is significant for heart disease on her father's side, and her father has prostate cancer. Her mother, who was an alcoholic, recently passed away. Her sibling has asthma and is prediabetic. Her paternal grandmother had open heart surgery and lived to 32.  Socially, she lives alone, is single, and occasionally consumes alcohol. She vapes nicotine daily but does not smoke cigarettes or use marijuana.  No thoughts of self-harm or harm to others. Reports anxiety, depression, insomnia, and frequent waking during the  night.    Review of Systems  Constitutional:  Negative for appetite change, chills, fatigue and fever.  HENT:  Negative for congestion, postnasal drip, rhinorrhea and sneezing.   Respiratory:  Negative for cough, shortness of breath and wheezing.   Cardiovascular:  Negative for chest pain, palpitations and leg swelling.  Gastrointestinal:  Negative for abdominal pain, constipation, nausea and vomiting.  Genitourinary:  Negative for difficulty urinating, dysuria, flank pain and frequency.  Musculoskeletal:  Negative for arthralgias, back pain, joint swelling and myalgias.  Skin:  Negative for color change, pallor, rash and wound.  Neurological:  Negative for dizziness, facial asymmetry, weakness, numbness and headaches.  Psychiatric/Behavioral:  Negative for behavioral problems, confusion, self-injury and suicidal ideas.         Objective:    BP 122/75   Pulse 76   Temp 98.1 F (36.7 C) (Oral)   Wt 131 lb (59.4 kg)   SpO2 100%   BMI 20.52 kg/m    Physical Exam Vitals and nursing note reviewed.  Constitutional:      General: She is not in acute distress.    Appearance: Normal appearance. She is not ill-appearing, toxic-appearing or diaphoretic.  Eyes:     General: No scleral icterus.       Right eye: No discharge.        Left eye: No discharge.     Extraocular Movements: Extraocular movements intact.     Conjunctiva/sclera: Conjunctivae normal.  Cardiovascular:     Rate and Rhythm: Normal rate and regular rhythm.     Pulses: Normal pulses.  Heart sounds: Normal heart sounds. No murmur heard.    No friction rub. No gallop.  Pulmonary:     Effort: Pulmonary effort is normal. No respiratory distress.     Breath sounds: Normal breath sounds. No stridor. No wheezing, rhonchi or rales.  Chest:     Chest wall: No tenderness.  Abdominal:     General: There is no distension.     Palpations: Abdomen is soft.     Tenderness: There is no abdominal tenderness. There is no  right CVA tenderness, left CVA tenderness or guarding.  Musculoskeletal:        General: No swelling, tenderness, deformity or signs of injury.     Right lower leg: No edema.     Left lower leg: No edema.  Skin:    General: Skin is warm and dry.     Capillary Refill: Capillary refill takes less than 2 seconds.     Coloration: Skin is not jaundiced or pale.     Findings: No bruising, erythema or lesion.  Neurological:     Mental Status: She is alert and oriented to person, place, and time.     Motor: No weakness.     Gait: Gait normal.  Psychiatric:        Mood and Affect: Mood normal.        Behavior: Behavior normal.        Thought Content: Thought content normal.        Judgment: Judgment normal.     No results found for any visits on 02/19/24.      Assessment & Plan:  Assessment and Plan Assessment & Plan      Problem List Items Addressed This Visit       Endocrine   Endometrioma of both ovaries   Advised to maintain close follow-up with gynecologist        Other   Anxiety and depression - Primary      02/19/2024   12:17 PM  GAD 7 : Generalized Anxiety Score  Nervous, Anxious, on Edge 3  Control/stop worrying 3  Worry too much - different things 3  Trouble relaxing 3  Restless 1  Easily annoyed or irritable 1  Afraid - awful might happen 3  Total GAD 7 Score 17  Anxiety Difficulty Somewhat difficult        02/19/2024   11:17 AM  Depression screen PHQ 2/9  Decreased Interest 1  Down, Depressed, Hopeless 1  PHQ - 2 Score 2  Altered sleeping 3  Tired, decreased energy 2  Change in appetite 1  Feeling bad or failure about yourself  0  Trouble concentrating 1  Moving slowly or fidgety/restless 0  Suicidal thoughts 0  PHQ-9 Score 9  Difficult doing work/chores Somewhat difficult   Chronic anxiety and depression with insomnia. Previous sertraline caused emotional blunting and decreased libido. No suicidal ideation. Therapy interest limited by  insurance. Duloxetine  considered for lower sexual dysfunction risk. - Prescribe duloxetine  30 mg tablet daily. Hydroxyzine  25 mg 3 times daily as needed - [facility] a therapist within the [facility]. She denies SI, HI follow-up in 4 weeks      Relevant Medications   hydrOXYzine  (VISTARIL ) 25 MG capsule   DULoxetine  (CYMBALTA ) 30 MG capsule   Other Relevant Orders   AMB Referral VBCI Care Management   TSH   Vapes nicotine containing substance   Nicotine dependence (vaping) Daily vaping with advised negative impact on lung health. - Encourage smoking  cessation and discuss health risks associated with vaping.      Insomnia    Chronic insomnia with difficulty falling asleep and frequent awakenings, exacerbated by anxiety. Hydroxyzine  previously effective. - Prescribe hydroxyzine  as needed for sleep and anxiety.       Meds ordered this encounter  Medications   hydrOXYzine  (VISTARIL ) 25 MG capsule    Sig: Take 1 capsule (25 mg total) by mouth every 8 (eight) hours as needed.    Dispense:  30 capsule    Refill:  0   DULoxetine  (CYMBALTA ) 30 MG capsule    Sig: Take 1 capsule (30 mg total) by mouth daily.    Dispense:  60 capsule    Refill:  3    Return in about 6 weeks (around 04/01/2024) for ANXIETY, DEPRESSION.  Tyreshia Ingman R Vartan Kerins, FNP

## 2024-02-19 NOTE — Assessment & Plan Note (Signed)
 Advised to maintain close follow-up with gynecologist

## 2024-02-19 NOTE — Patient Instructions (Signed)
 1. Anxiety and depression (Primary)  - hydrOXYzine  (VISTARIL ) 25 MG capsule; Take 1 capsule (25 mg total) by mouth every 8 (eight) hours as needed.  Dispense: 30 capsule; Refill: 0 - DULoxetine  (CYMBALTA ) 30 MG capsule; Take 1 capsule (30 mg total) by mouth daily.  Dispense: 60 capsule; Refill: 3 - AMB Referral VBCI Care Management - TSH     Please maintain simple sleep hygiene. - Maintain dark and non-noisy environment in the bedroom. - Please use the bedroom for sleep and sexual activity only. - Do not use electronic devices in the bedroom. - Please take dinner at least 2 hours before bedtime. - Please avoid caffeinated products in the evening, including coffee, soft drinks. - Please try to maintain the regular sleep-wake cycle - Go to bed and wake up at the same time.    It is important that you exercise regularly at least 30 minutes 5 times a week as tolerated  Think about what you will eat, plan ahead. Choose  clean, green, fresh or frozen over canned, processed or packaged foods which are more sugary, salty and fatty. 70 to 75% of food eaten should be vegetables and fruit. Three meals at set times with snacks allowed between meals, but they must be fruit or vegetables. Aim to eat over a 12 hour period , example 7 am to 7 pm, and STOP after  your last meal of the day. Drink water,generally about 64 ounces per day, no other drink is as healthy. Fruit juice is best enjoyed in a healthy way, by EATING the fruit.  Thanks for choosing [facility] we consider it a privelige to serve you.

## 2024-02-19 NOTE — Assessment & Plan Note (Signed)
 Nicotine dependence (vaping) Daily vaping with advised negative impact on lung health. - Encourage smoking cessation and discuss health risks associated with vaping.

## 2024-02-20 ENCOUNTER — Ambulatory Visit: Payer: Self-pay | Admitting: Nurse Practitioner

## 2024-02-20 LAB — TSH: TSH: 1.48 u[IU]/mL (ref 0.450–4.500)

## 2024-02-26 ENCOUNTER — Other Ambulatory Visit: Payer: Self-pay

## 2024-02-26 ENCOUNTER — Ambulatory Visit
Admission: RE | Admit: 2024-02-26 | Discharge: 2024-02-26 | Disposition: A | Discharge status: Home / Self Care | Attending: Physician Assistant | Admitting: Physician Assistant

## 2024-02-26 VITALS — BP 131/87 | HR 59 | Temp 98.5°F | Resp 18 | Ht 67.0 in | Wt 131.0 lb

## 2024-02-26 DIAGNOSIS — N949 Unspecified condition associated with female genital organs and menstrual cycle: Secondary | ICD-10-CM | POA: Insufficient documentation

## 2024-02-26 DIAGNOSIS — Z113 Encounter for screening for infections with a predominantly sexual mode of transmission: Secondary | ICD-10-CM | POA: Diagnosis not present

## 2024-02-26 LAB — POCT URINE DIPSTICK
Bilirubin, UA: NEGATIVE
Glucose, UA: NEGATIVE mg/dL
Ketones, POC UA: NEGATIVE mg/dL
Leukocytes, UA: NEGATIVE
Nitrite, UA: NEGATIVE
POC PROTEIN,UA: NEGATIVE
Spec Grav, UA: 1.01 (ref 1.010–1.025)
Urobilinogen, UA: 0.2 U/dL
pH, UA: 7 (ref 5.0–8.0)

## 2024-02-26 LAB — POCT URINE PREGNANCY: Preg Test, Ur: NEGATIVE

## 2024-02-26 NOTE — ED Triage Notes (Signed)
 Pt presents to urgent care for STD testing today. Pt states she has a new sex partner and believes she has BV. Currently on her menstrual cycle. Has noticed an abnormal smell to her pads. Currently denies pain. Pt denies wanting blood work, only requesting vaginal swab.

## 2024-02-26 NOTE — ED Provider Notes (Signed)
 [facility]    CSN: [id number] Arrival date & time: 02/26/24  1209      History   Chief Complaint Chief Complaint  Patient presents with   SEXUALLY TRANSMITTED DISEASE    Entered by patient    HPI Caroline Lynch is a 28 y.o. female.   HPI  Pt presents today for concerns of vulvovaginal odor  She states she has a new sexual partner and would like STD testing  She is unsure if she is having vaginal discharge changes as she is currently on her period. She denies vaginal pain  Her partner has not expresses concerns for exposure or STD symptoms     Past Medical History:  Diagnosis Date   Endometriosis    Ovarian cyst     Patient Active Problem List   Diagnosis Date Noted   Anxiety and depression 02/19/2024   Vapes nicotine containing substance 02/19/2024   Insomnia 02/19/2024   Endometrioma of both ovaries 10/29/2023   AKI (acute kidney injury) (HCC) 10/29/2023   Abortion with septicemia 10/26/2023   Sepsis following complete or unspecified spontaneous abortion 10/26/2023    Past Surgical History:  Procedure Laterality Date   DIAGNOSTIC LARYNGOSCOPY  11/07/2021   Procedure: DIAGNOSTIC LARYNGOSCOPY, lysis of adhesions, peritoneal wall biopsy;  Surgeon: Fredirick Glenys RAMAN, MD;  Location: [facility];  Service: Gynecology;;   DILATION AND CURETTAGE OF UTERUS N/A 10/30/2023   Procedure: ULTRASOUND GUIDED DILATION AND EVACUATION;  Surgeon: Zina Jerilynn LABOR, MD;  Location: [facility] OR;  Service: Gynecology;  Laterality: N/A;   THERAPEUTIC ABORTION      OB History   No obstetric history on file.      Home Medications    Prior to Admission medications   Medication Sig Start Date End Date Taking? Authorizing Provider  cetirizine  (ZYRTEC  ALLERGY) 10 MG tablet Take 1 tablet (10 mg total) by mouth daily. 01/10/24   Christopher Savannah, PA-C  clindamycin  (CLEOCIN ) 300 MG capsule Take 1 capsule (300 mg total) by mouth 3 (three) times daily. 01/28/24   Christopher Savannah, PA-C  DULoxetine   (CYMBALTA ) 30 MG capsule Take 1 capsule (30 mg total) by mouth daily. 02/19/24   Paseda, Folashade R, FNP  etonogestrel -ethinyl estradiol  (NUVARING) 0.12-0.015 MG/24HR vaginal ring Insert vaginally and leave in place for 4 consecutive weeks, then swtich Patient not taking: Reported on 02/19/2024 08/31/23   Ajewole, Christana, MD  fluconazole  (DIFLUCAN ) 150 MG tablet Take 1 tablet (150 mg total) by mouth every 3 (three) days. 01/28/24   Christopher Savannah, PA-C  hydrOXYzine  (VISTARIL ) 25 MG capsule Take 1 capsule (25 mg total) by mouth every 8 (eight) hours as needed. 02/19/24   Paseda, Folashade R, FNP  ibuprofen  (ADVIL ) 600 MG tablet Take 1 tablet (600 mg total) by mouth every 6 (six) hours as needed for mild pain (pain score 1-3), moderate pain (pain score 4-6), cramping or fever (1st line). 10/30/23   Zina Jerilynn LABOR, MD  promethazine -dextromethorphan (PROMETHAZINE -DM) 6.25-15 MG/5ML syrup Take 5 mLs by mouth 3 (three) times daily as needed for cough. Patient not taking: Reported on 02/19/2024 01/10/24   Christopher Savannah, PA-C  pseudoephedrine  (SUDAFED) 30 MG tablet Take 1 tablet (30 mg total) by mouth every 8 (eight) hours as needed for congestion. Patient not taking: Reported on 02/19/2024 01/10/24   Christopher Savannah, PA-C  tobramycin  (TOBREX ) 0.3 % ophthalmic solution Place 1 drop into both eyes every 4 (four) hours. Patient not taking: Reported on 02/19/2024 01/10/24   Christopher Savannah, PA-C  Family History Family History  Problem Relation Age of Onset   Alcoholism Mother    Prostate cancer Father    Asthma Sister    Heart disease Maternal Grandmother     Social History Social History   Tobacco Use   Smoking status: Some Days    Types: Cigarettes, E-cigarettes   Smokeless tobacco: Never  Vaping Use   Vaping status: Some Days  Substance Use Topics   Alcohol use: Yes    Comment: occasional   Drug use: Yes    Types: Marijuana    Comment: occasional     Allergies   Patient has no known  allergies.   Review of Systems Review of Systems  Constitutional:  Negative for chills and fever.  Gastrointestinal:  Positive for abdominal pain (cramping from menses).  Genitourinary:  Negative for difficulty urinating, dysuria, flank pain, frequency, genital sores, menstrual problem, vaginal bleeding, vaginal discharge and vaginal pain.  Musculoskeletal:  Positive for back pain (from menses).     Physical Exam Triage Vital Signs ED Triage Vitals  Encounter Vitals Group     BP 02/26/24 1217 131/87     Girls Systolic BP Percentile --      Girls Diastolic BP Percentile --      Boys Systolic BP Percentile --      Boys Diastolic BP Percentile --      Pulse Rate 02/26/24 1217 (!) 59     Resp 02/26/24 1217 18     Temp 02/26/24 1217 98.5 F (36.9 C)     Temp Source 02/26/24 1217 Oral     SpO2 02/26/24 1217 98 %     Weight 02/26/24 1217 131 lb (59.4 kg)     Height 02/26/24 1217 5' 7 (1.702 m)     Head Circumference --      Peak Flow --      Pain Score 02/26/24 1234 0     Pain Loc --      Pain Education --      Exclude from Growth Chart --    No data found.  Updated Vital Signs BP 131/87 (BP Location: Right Arm)   Pulse (!) 59   Temp 98.5 F (36.9 C) (Oral)   Resp 18   Ht 5' 7 (1.702 m)   Wt 131 lb (59.4 kg)   LMP 02/24/2024 (Approximate)   SpO2 98%   BMI 20.52 kg/m   Visual Acuity Right Eye Distance:   Left Eye Distance:   Bilateral Distance:    Right Eye Near:   Left Eye Near:    Bilateral Near:     Physical Exam Vitals reviewed.  Constitutional:      General: She is awake.     Appearance: Normal appearance. She is well-developed and well-groomed.  HENT:     Head: Normocephalic and atraumatic.  Eyes:     General: Lids are normal. Gaze aligned appropriately.     Extraocular Movements: Extraocular movements intact.     Conjunctiva/sclera: Conjunctivae normal.  Pulmonary:     Effort: Pulmonary effort is normal.  Neurological:     Mental Status: She  is alert and oriented to person, place, and time.  Psychiatric:        Attention and Perception: Attention and perception normal.        Mood and Affect: Mood and affect normal.        Speech: Speech normal.        Behavior: Behavior normal. Behavior is cooperative.  UC Treatments / Results  Labs (all labs ordered are listed, but only abnormal results are displayed) Labs Reviewed  POCT URINE DIPSTICK - Abnormal; Notable for the following components:      Result Value   Color, UA light yellow (*)    Blood, UA moderate (*)    All other components within normal limits  RPR  HIV ANTIBODY (ROUTINE TESTING W REFLEX)  POCT URINE PREGNANCY  CERVICOVAGINAL ANCILLARY ONLY    EKG   Radiology No results found.  Procedures Procedures (including critical care time)  Medications Ordered in UC Medications - No data to display  Initial Impression / Assessment and Plan / UC Course  I have reviewed the triage vital signs and the nursing notes.  Pertinent labs & imaging results that were available during my care of the patient were reviewed by me and considered in my medical decision making (see chart for details).      Final Clinical Impressions(s) / UC Diagnoses   Final diagnoses:  Vaginal discomfort  Screening examination for STD (sexually transmitted disease)   Patient presents today with concerns for vulvovaginal odor and discomfort.  She reports that she would like STD testing to assess for potential BV.  She is unsure if she is having abnormal vaginal bleeding as she is currently on her menses.  Urine dip is notable for blood but I suspect this is likely contamination from menses.  Patient is amenable to full STD workup so we will collect blood work for HIV and syphilis testing.  Will collect cervicovaginal swab to assess for BV, yeast, trichomoniasis, gonorrhea, chlamydia.  Results to dictate further management.  Follow-up as needed or indicated by test  results.    Discharge Instructions       We will keep you updated on the results of your cervicovaginal swab once the results are available.  If medication or treatment is indicated by those results that will be sent into the pharmacy that we have on file. Please make sure that you are practicing safe sex and using barrier methods to prevent exposure. It is recommended to avoid intercourse until you have the results back from testing and have completed any treatments that are sent in for you.       ED Prescriptions   None    PDMP not reviewed this encounter.   Marylene Rocky FORBES DEVONNA 02/26/24 2035

## 2024-02-26 NOTE — Discharge Instructions (Signed)
 We will keep you updated on the results of your cervicovaginal swab once the results are available.  If medication or treatment is indicated by those results that will be sent into the pharmacy that we have on file. Please make sure that you are practicing safe sex and using barrier methods to prevent exposure. It is recommended to avoid intercourse until you have the results back from testing and have completed any treatments that are sent in for you.

## 2024-02-27 LAB — RPR: RPR Ser Ql: NONREACTIVE

## 2024-02-27 LAB — HIV ANTIBODY (ROUTINE TESTING W REFLEX): HIV Screen 4th Generation wRfx: NONREACTIVE

## 2024-02-29 LAB — CERVICOVAGINAL ANCILLARY ONLY
Bacterial Vaginitis (gardnerella): POSITIVE — AB
Candida Glabrata: NEGATIVE
Candida Vaginitis: POSITIVE — AB
Chlamydia: NEGATIVE
Comment: NEGATIVE
Comment: NEGATIVE
Comment: NEGATIVE
Comment: NEGATIVE
Comment: NEGATIVE
Comment: NORMAL
Neisseria Gonorrhea: NEGATIVE
Trichomonas: NEGATIVE

## 2024-03-04 ENCOUNTER — Telehealth: Payer: Self-pay | Admitting: Emergency Medicine

## 2024-03-04 ENCOUNTER — Ambulatory Visit (HOSPITAL_COMMUNITY): Payer: Self-pay

## 2024-03-04 MED ORDER — METRONIDAZOLE 500 MG PO TABS: 500.0000 mg | ORAL_TABLET | Freq: Two times a day (BID) | ORAL | 0 refills | Status: DC

## 2024-03-04 MED ORDER — FLUCONAZOLE 150 MG PO TABS
ORAL_TABLET | ORAL | 0 refills | Status: DC
Start: 2024-03-04 — End: 2024-03-21

## 2024-03-04 NOTE — Telephone Encounter (Signed)
 Positive for BV and yeast. Treatment sent to pharmacy.

## 2024-03-15 ENCOUNTER — Telehealth: Payer: Self-pay | Admitting: *Deleted

## 2024-03-15 NOTE — Progress Notes (Signed)
 Complex Care Management Note  Care Guide Note 03/15/2024 Name: Caroline Lynch MRN: [id number] DOB: 08-Feb-1996  Caroline Lynch is a 28 y.o. year old female who sees Paseda, Folashade R, FNP for primary care. I reached out to Norman Silvan by phone today to offer complex care management services.  [name] was given information about Complex Care Management services today including:   The Complex Care Management services include support from the care team which includes your Nurse Care Manager, Clinical Social Worker, or Pharmacist.  The Complex Care Management team is here to help remove barriers to the health concerns and goals most important to you. Complex Care Management services are voluntary, and the patient may decline or stop services at any time by request to their care team member.   Complex Care Management Consent Status: Patient agreed to services and verbal consent obtained.   Follow up plan:  Telephone appointment with complex care management team member scheduled for:  03/29/24  Encounter Outcome:  Patient Scheduled  Harlene Satterfield  [facility]  [facility], [facility] Guide  Direct Dial: [phone]  Fax [phone]

## 2024-03-21 ENCOUNTER — Telehealth: Admitting: Physician Assistant

## 2024-03-21 DIAGNOSIS — B379 Candidiasis, unspecified: Secondary | ICD-10-CM

## 2024-03-21 DIAGNOSIS — R3989 Other symptoms and signs involving the genitourinary system: Secondary | ICD-10-CM | POA: Diagnosis not present

## 2024-03-21 DIAGNOSIS — T3695XA Adverse effect of unspecified systemic antibiotic, initial encounter: Secondary | ICD-10-CM | POA: Diagnosis not present

## 2024-03-21 MED ORDER — FLUCONAZOLE 150 MG PO TABS: 150.0000 mg | ORAL_TABLET | ORAL | 0 refills | Status: DC | PRN

## 2024-03-21 MED ORDER — NITROFURANTOIN MONOHYD MACRO 100 MG PO CAPS: 100.0000 mg | ORAL_CAPSULE | Freq: Two times a day (BID) | ORAL | 0 refills | Status: DC

## 2024-03-21 NOTE — Patient Instructions (Signed)
 Norman Silvan, thank you for joining Delon CHRISTELLA Dickinson, PA-C for today's virtual visit.  While this provider is not your primary care provider (PCP), if your PCP is located in our provider database this encounter information will be shared with them immediately following your visit.   A [facility] MyChart account gives you access to today's visit and all your visits, tests, and labs performed at [facility]  click here if you don't have a [facility] MyChart account or go to [email or web address]  Consent: (Patient) Cantrell Martus provided verbal consent for this virtual visit at the beginning of the encounter.  Current Medications:  Current Outpatient Medications:    fluconazole  (DIFLUCAN ) 150 MG tablet, Take 1 tablet (150 mg total) by mouth every 3 (three) days as needed., Disp: 2 tablet, Rfl: 0   nitrofurantoin , macrocrystal-monohydrate, (MACROBID ) 100 MG capsule, Take 1 capsule (100 mg total) by mouth 2 (two) times daily., Disp: 10 capsule, Rfl: 0   cetirizine  (ZYRTEC  ALLERGY) 10 MG tablet, Take 1 tablet (10 mg total) by mouth daily., Disp: 30 tablet, Rfl: 0   DULoxetine  (CYMBALTA ) 30 MG capsule, Take 1 capsule (30 mg total) by mouth daily., Disp: 60 capsule, Rfl: 3   etonogestrel -ethinyl estradiol  (NUVARING) 0.12-0.015 MG/24HR vaginal ring, Insert vaginally and leave in place for 4 consecutive weeks, then swtich (Patient not taking: Reported on 02/19/2024), Disp: 1 each, Rfl: 11   hydrOXYzine  (VISTARIL ) 25 MG capsule, Take 1 capsule (25 mg total) by mouth every 8 (eight) hours as needed., Disp: 30 capsule, Rfl: 0   ibuprofen  (ADVIL ) 600 MG tablet, Take 1 tablet (600 mg total) by mouth every 6 (six) hours as needed for mild pain (pain score 1-3), moderate pain (pain score 4-6), cramping or fever (1st line)., Disp: 30 tablet, Rfl: 0   metroNIDAZOLE  (FLAGYL ) 500 MG tablet, Take 1 tablet (500 mg total) by mouth 2 (two) times daily., Disp: 14 tablet, Rfl: 0   pseudoephedrine   (SUDAFED) 30 MG tablet, Take 1 tablet (30 mg total) by mouth every 8 (eight) hours as needed for congestion. (Patient not taking: Reported on 02/19/2024), Disp: 30 tablet, Rfl: 0   tobramycin  (TOBREX ) 0.3 % ophthalmic solution, Place 1 drop into both eyes every 4 (four) hours. (Patient not taking: Reported on 02/19/2024), Disp: 5 mL, Rfl: 0   Medications ordered in this encounter:  Meds ordered this encounter  Medications   nitrofurantoin , macrocrystal-monohydrate, (MACROBID ) 100 MG capsule    Sig: Take 1 capsule (100 mg total) by mouth 2 (two) times daily.    Dispense:  10 capsule    Refill:  0    Supervising Provider:   LAMPTEY, PHILIP O [[id number]]   fluconazole  (DIFLUCAN ) 150 MG tablet    Sig: Take 1 tablet (150 mg total) by mouth every 3 (three) days as needed.    Dispense:  2 tablet    Refill:  0    Supervising Provider:   LAMPTEY, PHILIP O [[id number]]     *If you need refills on other medications prior to your next appointment, please contact your pharmacy*  Follow-Up: Call back or seek an in-person evaluation if the symptoms worsen or if the condition fails to improve as anticipated.  [facility] [phone]  Other Instructions Urinary Tract Infection, Female A urinary tract infection (UTI) is an infection in your urinary tract. The urinary tract is made up of organs that make, store, and get rid of pee (urine) in your body. These organs include: The kidneys. The ureters.  The bladder. The urethra. What are the causes? Most UTIs are caused by germs called bacteria. They may be in or near your genitals. These germs grow and cause swelling in your urinary tract. What increases the risk? You're more likely to get a UTI if: You're a female. The urethra is shorter in females than in males. You have a soft tube called a catheter that drains your pee. You can't control when you pee or poop. You have trouble peeing because of: A kidney stone. A urinary blockage. A  nerve condition that affects your bladder. Not getting enough to drink. You're sexually active. You use a birth control inside your vagina, like spermicide. You're pregnant. You have low levels of the hormone estrogen in your body. You're an older adult. You're also more likely to get a UTI if you have other health problems. These may include: Diabetes. A weak immune system. Your immune system is your body's defense system. Sickle cell disease. Injury of the spine. What are the signs or symptoms? Symptoms may include: Needing to pee right away. Peeing small amounts often. Pain or burning when you pee. Blood in your pee. Pee that smells bad or odd. Pain in your belly or lower back. You may also: Feel confused. This may be the first symptom in older adults. Vomit. Not feel hungry. Feel tired or easily annoyed. Have a fever or chills. How is this diagnosed? A UTI is diagnosed based on your medical history and an exam. You may also have other tests. These may include: Pee tests. Blood tests. Tests for sexually transmitted infections (STIs). If you've had more than one UTI, you may need to have imaging studies done to find out why you keep getting them. How is this treated? A UTI can be treated by: Taking antibiotics or other medicines. Drinking enough fluid to keep your pee pale yellow. In rare cases, a UTI can cause a very bad condition called sepsis. Sepsis may be treated in the hospital. Follow these instructions at home: Medicines Take your medicines only as told by your health care provider. If you were given antibiotics, take them as told by your provider. Do not stop taking them even if you start to feel better. General instructions Make sure you: Pee often and fully. Do not hold your pee for a long time. Wipe from front to back after you pee or poop. Use each tissue only once when you wipe. Pee after you have sex. Do not douche or use sprays or powders in your  genital area. Contact a health care provider if: Your symptoms don't get better after 1-2 days of taking antibiotics. Your symptoms go away and then come back. You have a fever or chills. You vomit or feel like you may vomit. Get help right away if: You have very bad pain in your back or lower belly. You faint. This information is not intended to replace advice given to you by your health care provider. Make sure you discuss any questions you have with your health care provider. Document Revised: 06/03/2023 Document Reviewed: 09/26/2022 Elsevier Patient Education  [employer].   If you have been instructed to have an in-person evaluation today at a local Urgent Care facility, please use the link below. It will take you to a list of all of our available [facility], including address, phone number and hours of operation. Please do not delay care.  [facility]  If you or a family member  do not have a primary care provider, use the link below to schedule a visit and establish care. When you choose a [facility] primary care physician or advanced practice provider, you gain a long-term partner in health. Find a Primary Care Provider  Learn more about [facility]'s in-office and virtual care options:  - Get Care Now

## 2024-03-21 NOTE — Progress Notes (Signed)
 Virtual Visit Consent   Caroline Lynch, you are scheduled for a virtual visit with a [facility] provider today. Just as with appointments in the office, your consent must be obtained to participate. Your consent will be active for this visit and any virtual visit you may have with one of our providers in the next 365 days. If you have a MyChart account, a copy of this consent can be sent to you electronically.  As this is a virtual visit, video technology does not allow for your provider to perform a traditional examination. This may limit your provider's ability to fully assess your condition. If your provider identifies any concerns that need to be evaluated in person or the need to arrange testing (such as labs, EKG, etc.), we will make arrangements to do so. Although advances in technology are sophisticated, we cannot ensure that it will always work on either your end or our end. If the connection with a video visit is poor, the visit may have to be switched to a telephone visit. With either a video or telephone visit, we are not always able to ensure that we have a secure connection.  By engaging in this virtual visit, you consent to the provision of healthcare and authorize for your insurance to be billed (if applicable) for the services provided during this visit. Depending on your insurance coverage, you may receive a charge related to this service.  I need to obtain your verbal consent now. Are you willing to proceed with your visit today? Adrine Hayworth has provided verbal consent on 03/21/2024 for a virtual visit (video or telephone). Caroline CHRISTELLA Dickinson, Caroline Lynch  Date: 03/21/2024 6:05 PM   Virtual Visit via Video Note   I, Caroline Lynch, connected with  Caroline Lynch  ([id number], January 23, 1996) on 03/21/24 at  6:00 PM EDT by a video-enabled telemedicine application and verified that I am speaking with the correct person using two identifiers.  Location: Patient: Virtual Visit Location  Patient: Mobile Provider: Virtual Visit Location Provider: Home Office   I discussed the limitations of evaluation and management by telemedicine and the availability of in person appointments. The patient expressed understanding and agreed to proceed.    History of Present Illness: Caroline Lynch is a 28 y.o. who identifies as a female who was assigned female at birth, and is being seen today for dysuria.  HPI: Urinary Tract Infection  This is a new problem. The current episode started today. The problem occurs every urination. The problem has been unchanged. The quality of the pain is described as burning. The pain is mild. There has been no fever. Associated symptoms include frequency, hematuria (mild) and hesitancy. Pertinent negatives include no chills, discharge, flank pain, nausea, possible pregnancy or sweats. She has tried increased fluids for the symptoms. The treatment provided no relief.   Recently treated for BV and yeast infection on 03/04/24.  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   AKI (acute kidney injury) (HCC) 10/29/2023   Abortion with septicemia 10/26/2023   Sepsis following complete or unspecified spontaneous abortion 10/26/2023    Allergies: No Known Allergies Medications:  Current Outpatient Medications:    fluconazole  (DIFLUCAN ) 150 MG tablet, Take 1 tablet (150 mg total) by mouth every 3 (three) days as needed., Disp: 2 tablet, Rfl: 0   nitrofurantoin , macrocrystal-monohydrate, (MACROBID ) 100  MG capsule, Take 1 capsule (100 mg total) by mouth 2 (two) times daily., Disp: 10 capsule, Rfl: 0   cetirizine  (ZYRTEC  ALLERGY) 10 MG tablet, Take 1 tablet (10 mg total) by mouth daily., Disp: 30 tablet, Rfl: 0   DULoxetine  (CYMBALTA ) 30 MG capsule, Take 1 capsule (30 mg total) by mouth daily., Disp: 60  capsule, Rfl: 3   etonogestrel -ethinyl estradiol  (NUVARING) 0.12-0.015 MG/24HR vaginal ring, Insert vaginally and leave in place for 4 consecutive weeks, then swtich (Patient not taking: Reported on 02/19/2024), Disp: 1 each, Rfl: 11   hydrOXYzine  (VISTARIL ) 25 MG capsule, Take 1 capsule (25 mg total) by mouth every 8 (eight) hours as needed., Disp: 30 capsule, Rfl: 0   ibuprofen  (ADVIL ) 600 MG tablet, Take 1 tablet (600 mg total) by mouth every 6 (six) hours as needed for mild pain (pain score 1-3), moderate pain (pain score 4-6), cramping or fever (1st line)., Disp: 30 tablet, Rfl: 0   metroNIDAZOLE  (FLAGYL ) 500 MG tablet, Take 1 tablet (500 mg total) by mouth 2 (two) times daily., Disp: 14 tablet, Rfl: 0   pseudoephedrine  (SUDAFED) 30 MG tablet, Take 1 tablet (30 mg total) by mouth every 8 (eight) hours as needed for congestion. (Patient not taking: Reported on 02/19/2024), Disp: 30 tablet, Rfl: 0   tobramycin  (TOBREX ) 0.3 % ophthalmic solution, Place 1 drop into both eyes every 4 (four) hours. (Patient not taking: Reported on 02/19/2024), Disp: 5 mL, Rfl: 0  Observations/Objective: Patient is well-developed, well-nourished in no acute distress.  Resting comfortably Head is normocephalic, atraumatic.  No labored breathing.  Speech is clear and coherent with logical content.  Patient is alert and oriented at baseline.    Assessment and Plan: 1. Suspected UTI (Primary) - nitrofurantoin , macrocrystal-monohydrate, (MACROBID ) 100 MG capsule; Take 1 capsule (100 mg total) by mouth 2 (two) times daily.  Dispense: 10 capsule; Refill: 0  2. Antibiotic-induced yeast infection - fluconazole  (DIFLUCAN ) 150 MG tablet; Take 1 tablet (150 mg total) by mouth every 3 (three) days as needed.  Dispense: 2 tablet; Refill: 0  - Worsening symptoms.  - Will treat empirically with Macrobid  - May use AZO for bladder spasms - Continue to push fluids.  - Diflucan  given as prophylaxis as patient tends to get vaginal  yeast infections with antibiotic use - Seek in person evaluation for urine culture if symptoms do not improve or if they worsen.    Follow Up Instructions: I discussed the assessment and treatment plan with the patient. The patient was provided an opportunity to ask questions and all were answered. The patient agreed with the plan and demonstrated an understanding of the instructions.  A copy of instructions were sent to the patient via MyChart unless otherwise noted below.    The patient was advised to call back or seek an in-person evaluation if the symptoms worsen or if the condition fails to improve as anticipated.    Caroline CHRISTELLA Dickinson, Caroline Lynch

## 2024-03-29 ENCOUNTER — Encounter: Payer: Self-pay | Admitting: Licensed Clinical Social Worker

## 2024-03-29 ENCOUNTER — Telehealth: Payer: Self-pay | Admitting: Licensed Clinical Social Worker

## 2024-04-01 NOTE — Patient Instructions (Signed)
 Norman Silvan - I am sorry I was unable to reach you today for our scheduled appointment. I work with Paseda, Folashade R, FNP and am calling to support your healthcare needs. Please contact me at [phone] at your earliest convenience. I look forward to speaking with you soon.   Thank you,  Rolin Kerns, LCSW   [facility], [facility] Clinical Social Worker Direct Dial: [phone]  Fax: [phone] Website: [email or web address] 4:04 PM

## 2024-04-06 ENCOUNTER — Telehealth: Payer: Self-pay | Admitting: *Deleted

## 2024-04-06 NOTE — Progress Notes (Signed)
 Complex Care Management Care Guide Note  04/06/2024 Name: Caroline Lynch MRN: [id number] DOB: 02-03-1996  Caroline Lynch is a 28 y.o. year old female who is a primary care patient of Paseda, Folashade R, FNP and is actively engaged with the care management team. I reached out to Norman Silvan by phone today to assist with re-scheduling  with the Licensed Clinical Child psychotherapist.  Follow up plan: Unsuccessful telephone outreach attempt made. A HIPAA compliant phone message was left for the patient providing contact information and requesting a return call. Harlene Satterfield  [facility]  Value-Based Care Institute, [facility] Guide  Direct Dial: [phone]  Fax [phone]

## 2024-04-08 ENCOUNTER — Ambulatory Visit: Payer: Self-pay | Admitting: Nurse Practitioner

## 2024-04-08 ENCOUNTER — Encounter: Payer: Self-pay | Admitting: Nurse Practitioner

## 2024-04-08 VITALS — BP 118/76 | HR 118 | Temp 98.1°F | Wt 132.0 lb

## 2024-04-08 DIAGNOSIS — J029 Acute pharyngitis, unspecified: Secondary | ICD-10-CM | POA: Insufficient documentation

## 2024-04-08 DIAGNOSIS — Z720 Tobacco use: Secondary | ICD-10-CM | POA: Diagnosis not present

## 2024-04-08 MED ORDER — IBUPROFEN 600 MG PO TABS: 600.0000 mg | ORAL_TABLET | Freq: Three times a day (TID) | ORAL | 0 refills | Status: DC | PRN

## 2024-04-08 MED ORDER — AMOXICILLIN 500 MG PO CAPS: 500.0000 mg | ORAL_CAPSULE | Freq: Two times a day (BID) | ORAL | 0 refills | Status: AC

## 2024-04-08 NOTE — Progress Notes (Signed)
 Acute Office Visit  Subjective:     Patient ID: Caroline Lynch, female    DOB: [birth year], 28 y.o.   MRN: [id number]  Chief Complaint  Patient presents with   Sore Throat    Since wed no cough but fever 103  earlier today    HPI  Discussed the use of AI scribe software for clinical note transcription with the patient, who gave verbal consent to proceed.  History of Present Illness Caroline Lynch is a 28 year old female who presents with severe throat pain and high fever.  She has experienced severe throat pain since Wednesday night, described as 'really bad', and it is accompanied by a high fever. No cough, sneezing, chest pain, or shortness of breath. She has a history of sore throat from July, which tested negative but was treated with Clindamycin , resolving her symptoms at that time.  She mentions that Tylenol  does not alleviate her symptoms effectively and inquires about taking ibuprofen  instead.  She occasionally smokes.  Assessment & Plan     Review of Systems  Constitutional:  Positive for fever. Negative for appetite change, chills and fatigue.  HENT:  Positive for sore throat. Negative for congestion, postnasal drip, rhinorrhea and sneezing.   Respiratory:  Negative for cough, shortness of breath and wheezing.   Cardiovascular:  Negative for chest pain, palpitations and leg swelling.  Gastrointestinal:  Negative for abdominal pain, constipation, nausea and vomiting.  Genitourinary:  Negative for difficulty urinating, dysuria, flank pain and frequency.  Musculoskeletal:  Negative for arthralgias, back pain, joint swelling and myalgias.  Skin:  Negative for color change, pallor, rash and wound.  Neurological:  Negative for dizziness, facial asymmetry, weakness, numbness and headaches.  Psychiatric/Behavioral:  Negative for behavioral problems, confusion, self-injury and suicidal ideas.         Objective:    BP 118/76   Pulse (!) 118   Temp 98.1 F (36.7 C)    Wt 132 lb (59.9 kg)   SpO2 100%   BMI 20.67 kg/m    Physical Exam Vitals reviewed.  Constitutional:      General: She is not in acute distress.    Appearance: Normal appearance. She is not ill-appearing, toxic-appearing or diaphoretic.  HENT:     Right Ear: Tympanic membrane, ear canal and external ear normal. There is no impacted cerumen.     Left Ear: Tympanic membrane, ear canal and external ear normal. There is no impacted cerumen.     Nose: No congestion or rhinorrhea.     Mouth/Throat:     Mouth: Mucous membranes are moist.     Pharynx: Oropharynx is clear. No oropharyngeal exudate or posterior oropharyngeal erythema.     Tonsils: No tonsillar exudate or tonsillar abscesses. 0 on the right. 0 on the left.  Eyes:     General: No scleral icterus.       Right eye: No discharge.        Left eye: No discharge.     Extraocular Movements: Extraocular movements intact.     Conjunctiva/sclera: Conjunctivae normal.  Cardiovascular:     Rate and Rhythm: Normal rate and regular rhythm.     Pulses: Normal pulses.     Heart sounds: Normal heart sounds. No murmur heard. Pulmonary:     Effort: Pulmonary effort is normal. No respiratory distress.     Breath sounds: Normal breath sounds. No stridor. No wheezing, rhonchi or rales.  Chest:     Chest wall: No tenderness.  Abdominal:  General: There is no distension.     Palpations: Abdomen is soft.     Tenderness: There is no abdominal tenderness. There is no guarding.  Musculoskeletal:        General: No tenderness.     Cervical back: Normal range of motion and neck supple. No rigidity or tenderness.     Right lower leg: No edema.     Left lower leg: No edema.  Lymphadenopathy:     Cervical: No cervical adenopathy.  Skin:    General: Skin is warm and dry.     Capillary Refill: Capillary refill takes less than 2 seconds.  Neurological:     Mental Status: She is alert and oriented to person, place, and time.     Motor: No  weakness.     Coordination: Coordination normal.     Gait: Gait normal.  Psychiatric:        Mood and Affect: Mood normal.        Behavior: Behavior normal.        Thought Content: Thought content normal.        Judgment: Judgment normal.     No results found for any visits on 04/08/24.      Assessment & Plan:   Problem List Items Addressed This Visit       Respiratory   Acute pharyngitis - Primary    Acute pharyngitis with severe throat pain and high fever. Rapid test normal, culture not performed due to pain. Previous similar episode treated with antibiotics. - Prescribe amoxicillin  500 mg orally twice daily for 10 days. - Advise gargling with salt water. - Encourage drinking warm fluids. - Refer to ENT for recurrent throat pain. - Prescribe ibuprofen  for pain and fever with instructions to take with food. Labs ordered to screen for infectious mononucleosis       Relevant Medications   amoxicillin  (AMOXIL ) 500 MG capsule   ibuprofen  (ADVIL ) 600 MG tablet   Other Relevant Orders   Mononucleosis Test, Qual W/ Reflex   Ambulatory referral to ENT   CMP14+EGFR   CBC with Differential/Platelet     Other   Vapes nicotine containing substance   Advise against smoking due to associated health risks.         Meds ordered this encounter  Medications   amoxicillin  (AMOXIL ) 500 MG capsule    Sig: Take 1 capsule (500 mg total) by mouth 2 (two) times daily for 10 days.    Dispense:  20 capsule    Refill:  0   ibuprofen  (ADVIL ) 600 MG tablet    Sig: Take 1 tablet (600 mg total) by mouth every 8 (eight) hours as needed.    Dispense:  20 tablet    Refill:  0    No follow-ups on file.  Laurinda Carreno R Nikolette Reindl, FNP

## 2024-04-08 NOTE — Assessment & Plan Note (Signed)
  Acute pharyngitis with severe throat pain and high fever. Rapid test normal, culture not performed due to pain. Previous similar episode treated with antibiotics. - Prescribe amoxicillin  500 mg orally twice daily for 10 days. - Advise gargling with salt water. - Encourage drinking warm fluids. - Refer to ENT for recurrent throat pain. - Prescribe ibuprofen  for pain and fever with instructions to take with food. Labs ordered to screen for infectious mononucleosis

## 2024-04-08 NOTE — Patient Instructions (Addendum)
 1. Acute pharyngitis, unspecified etiology (Primary)  - amoxicillin  (AMOXIL ) 500 MG capsule; Take 1 capsule (500 mg total) by mouth 2 (two) times daily for 10 days.  Dispense: 20 capsule; Refill: 0 - Mononucleosis Test, Qual W/ Reflex - Ambulatory referral to ENT - ibuprofen  (ADVIL ) 600 MG tablet; Take 1 tablet (600 mg total) by mouth every 8 (eight) hours as needed. Take with food   Dispense: 20 tablet; Refill: 0    It is important that you exercise regularly at least 30 minutes 5 times a week as tolerated  Think about what you will eat, plan ahead. Choose  clean, green, fresh or frozen over canned, processed or packaged foods which are more sugary, salty and fatty. 70 to 75% of food eaten should be vegetables and fruit. Three meals at set times with snacks allowed between meals, but they must be fruit or vegetables. Aim to eat over a 12 hour period , example 7 am to 7 pm, and STOP after  your last meal of the day. Drink water,generally about 64 ounces per day, no other drink is as healthy. Fruit juice is best enjoyed in a healthy way, by EATING the fruit.  Thanks for choosing [facility] we consider it a privelige to serve you.

## 2024-04-08 NOTE — Assessment & Plan Note (Addendum)
 Advise against smoking due to associated health risks.

## 2024-04-10 LAB — CMP14+EGFR
ALT: 9 IU/L (ref 0–32)
AST: 16 IU/L (ref 0–40)
Albumin: 4.6 g/dL (ref 4.0–5.0)
Alkaline Phosphatase: 57 IU/L (ref 41–116)
BUN/Creatinine Ratio: 11 (ref 9–23)
BUN: 9 mg/dL (ref 6–20)
Bilirubin Total: 0.5 mg/dL (ref 0.0–1.2)
CO2: 19 mmol/L — ABNORMAL LOW (ref 20–29)
Calcium: 10 mg/dL (ref 8.7–10.2)
Chloride: 100 mmol/L (ref 96–106)
Creatinine, Ser: 0.81 mg/dL (ref 0.57–1.00)
Globulin, Total: 3.2 g/dL (ref 1.5–4.5)
Glucose: 83 mg/dL (ref 70–99)
Potassium: 4.5 mmol/L (ref 3.5–5.2)
Sodium: 136 mmol/L (ref 134–144)
Total Protein: 7.8 g/dL (ref 6.0–8.5)
eGFR: 103 mL/min/1.73 (ref >59)

## 2024-04-10 LAB — CBC
Hematocrit: 37.1 % (ref 34.0–46.6)
Hemoglobin: 11.8 g/dL (ref 11.1–15.9)
MCH: 28.2 pg (ref 26.6–33.0)
MCHC: 31.8 g/dL (ref 31.5–35.7)
MCV: 89 fL (ref 79–97)
Platelets: 275 x10E3/uL (ref 150–450)
RBC: 4.18 x10E6/uL (ref 3.77–5.28)
RDW: 15 % (ref 11.7–15.4)
WBC: 14.1 x10E3/uL — ABNORMAL HIGH (ref 3.4–10.8)

## 2024-04-10 LAB — MONO QUAL W/RFLX QN

## 2024-04-11 ENCOUNTER — Ambulatory Visit: Payer: Self-pay | Admitting: Nurse Practitioner

## 2024-04-12 NOTE — Progress Notes (Signed)
 Complex Care Management Care Guide Note  04/12/2024 Name: [name] MRN: [id number] DOB: [birth year]  [name] is a 28 y.o. year old female who is a primary care patient of Paseda, Folashade R, FNP and is actively engaged with the care management team. I reached out to Norman Silvan by phone today to assist with re-scheduling  with the Licensed Clinical Child psychotherapist.  Follow up plan: Unsuccessful telephone outreach attempt made. A HIPAA compliant phone message was left for the patient providing contact information and requesting a return call. No further outreach attempts will be made at this time. We have been unable to contact the patient to reschedule for complex care management services.   Harlene Satterfield  [facility]  Value-Based Care Institute, [facility] Guide  Direct Dial: [phone]  Fax [phone]

## 2024-06-09 ENCOUNTER — Encounter (INDEPENDENT_AMBULATORY_CARE_PROVIDER_SITE_OTHER): Payer: Self-pay

## 2024-07-04 ENCOUNTER — Ambulatory Visit
Admission: RE | Admit: 2024-07-04 | Discharge: 2024-07-04 | Disposition: A | Discharge status: Home / Self Care | Source: Ambulatory Visit | Attending: Emergency Medicine | Admitting: Emergency Medicine

## 2024-07-04 VITALS — BP 123/82 | HR 96 | Temp 99.1°F | Resp 18

## 2024-07-04 DIAGNOSIS — Z3200 Encounter for pregnancy test, result unknown: Secondary | ICD-10-CM | POA: Diagnosis present

## 2024-07-04 DIAGNOSIS — Z113 Encounter for screening for infections with a predominantly sexual mode of transmission: Secondary | ICD-10-CM | POA: Diagnosis present

## 2024-07-04 DIAGNOSIS — N3001 Acute cystitis with hematuria: Secondary | ICD-10-CM | POA: Insufficient documentation

## 2024-07-04 DIAGNOSIS — R300 Dysuria: Secondary | ICD-10-CM | POA: Insufficient documentation

## 2024-07-04 LAB — POCT URINE DIPSTICK
Bilirubin, UA: NEGATIVE
Glucose, UA: NEGATIVE mg/dL
Ketones, POC UA: NEGATIVE mg/dL
Nitrite, UA: NEGATIVE
Protein Ur, POC: 100 mg/dL — AB
Spec Grav, UA: 1.02
Urobilinogen, UA: 1 U/dL
pH, UA: 6

## 2024-07-04 LAB — POCT URINE PREGNANCY: Preg Test, Ur: NEGATIVE

## 2024-07-04 MED ORDER — SULFAMETHOXAZOLE-TRIMETHOPRIM 800-160 MG PO TABS: 1.0000 | ORAL_TABLET | Freq: Two times a day (BID) | ORAL | 0 refills | Status: AC

## 2024-07-04 NOTE — Discharge Instructions (Addendum)
 Common causes of urinary tract infections include but are not limited to holding your urine longer than you should, squatting instead of sitting down when urinating, sitting around in wet clothing such as a wet swimsuit or gym clothes too long, not emptying your bladder after having sexual intercourse, wiping from back to front instead of front to back after having a bowel movement.     The urinalysis that we performed in the clinic today was abnormal.  Urine culture will be performed per our protocol.  The result of the urine culture will be available in the next 3 to 5 days and will be posted to your MyChart account.  If there is an abnormal finding, you will be contacted by phone and advised of further treatment recommendations, if any.   You were advised to begin antibiotics today because your urinalysis is abnormal and you are having active symptoms of an acute lower urinary tract infection also known as cystitis.     Please pick up and begin taking your prescription for Bactrim DS (trimethoprim sulfamethoxazole) as soon as possible.  Please take all doses exactly as prescribed.  You can take this medication with or without food.  This medication is safe to take with your other medications.   If you receive a phone call advising you that your urine culture is negative but you begin to feel better after taking antibiotics for 24 hours, please feel free to complete the full course of antibiotics as they were likely needed and the urine culture result was false.  If your culture is negative and you do not feel any better, please return for repeat evaluation of other possible causes of your symptoms.   Please consider abstaining from sexual intercourse while you are being treated for urinary tract infection.   The results of your vaginal swab STD testing which screens for gonorrhea, chlamydia, and trichomonas as well as your HIV and syphilis blood test results will be posted to your MyChart account once  they are complete.  This typically takes 1 to 3 days. If any of your results are abnormal, you will receive a phone call regarding treatment.  Prescriptions, if any are needed, will be provided for you at your pharmacy.                       If you have not had complete resolution of your symptoms after completing treatment as prescribed, please return to urgent care for repeat evaluation or follow-up with your primary care provider.  Repeat urinalysis and urine culture may be indicated for more directed therapy.   Your urine pregnancy test was negative.   Thank you for visiting [facility] today.  We appreciate the opportunity to participate in your care.

## 2024-07-04 NOTE — ED Triage Notes (Signed)
 Pt reports burning with urination and urinary frequency x 3 days. Has tried Azo with some relief. Requesting STD testing as well.

## 2024-07-04 NOTE — ED Provider Notes (Addendum)
 [facility]    CSN: [id number] Arrival date & time: 07/04/24  1732    HISTORY   Chief Complaint  Patient presents with   Urinary Frequency   Dysuria   SEXUALLY TRANSMITTED DISEASE   HPI Kaisha Wachob is a pleasant, 28 y.o. female who presents to urgent care today. Pt reports burning with urination and urinary frequency x 3 days. Has tried Azo with some relief. Requesting STD testing as well. Patient denies abnormal vaginal discharge, abnormal vaginal odor, vaginal itching, flank pain, fever, body aches, chills, rigors, malaise, significant fatigue, and known STD exposure.   The history is provided by the patient.  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   AKI (acute kidney injury) 10/29/2023   Abortion with septicemia 10/26/2023   Sepsis following complete or unspecified spontaneous abortion 10/26/2023   Past Surgical History:  Procedure Laterality Date   DIAGNOSTIC LARYNGOSCOPY  11/07/2021   Procedure: DIAGNOSTIC LARYNGOSCOPY, lysis of adhesions, peritoneal wall biopsy;  Surgeon: Fredirick Glenys RAMAN, MD;  Location: [facility] OR;  Service: Gynecology;;   DILATION AND CURETTAGE OF UTERUS N/A 10/30/2023   Procedure: ULTRASOUND GUIDED DILATION AND EVACUATION;  Surgeon: Zina Jerilynn LABOR, MD;  Location: [facility] OR;  Service: Gynecology;  Laterality: N/A;   THERAPEUTIC ABORTION     OB History   No obstetric history on file.    Home Medications    Prior to Admission medications  Medication Sig Start Date End Date Taking? Authorizing Provider  Acetaminophen  (TYLENOL  8 HOUR PO) Take by mouth.    [provider]  fluconazole  (DIFLUCAN ) 150 MG tablet Take 1 tablet (150 mg total) by  mouth every 3 (three) days as needed. Patient not taking: Reported on 04/08/2024 03/21/24   Vivienne Delon HERO, PA-C  hydrOXYzine  (VISTARIL ) 25 MG capsule Take 1 capsule (25 mg total) by mouth every 8 (eight) hours as needed. 02/19/24   Paseda, Folashade R, FNP  ibuprofen  (ADVIL ) 600 MG tablet Take 1 tablet (600 mg total) by mouth every 8 (eight) hours as needed. 04/08/24   Paseda, Folashade R, FNP  nitrofurantoin , macrocrystal-monohydrate, (MACROBID ) 100 MG capsule Take 1 capsule (100 mg total) by mouth 2 (two) times daily. Patient not taking: Reported on 04/08/2024 03/21/24   Vivienne Delon HERO, PA-C    Family History Family History  Problem Relation Age of Onset   Alcoholism Mother    Prostate cancer Father    Asthma Sister    Heart disease Maternal Grandmother    Social History Social History[1] Allergies   Patient has no known allergies.  Review of Systems Review of Systems  Genitourinary:  Positive for dysuria and frequency.   Pertinent findings revealed after performing a 14 point review of systems has been noted in the history of present illness.  Physical Exam Vital Signs BP 123/82 (BP Location: Right Arm)   Pulse 96   Temp 99.1 F (37.3 C) (Oral)   Resp 18   LMP 06/15/2024 (Exact Date)   SpO2 98%   No data found.  Physical Exam Vitals and nursing note reviewed.  Constitutional:      General: She is awake. She is not in acute distress.  Appearance: Normal appearance. She is well-developed and well-groomed. She is not ill-appearing.  HENT:     Head: Normocephalic and atraumatic.  Eyes:     General: Lids are normal.        Right eye: No discharge.        Left eye: No discharge.     Conjunctiva/sclera: Conjunctivae normal.     Right eye: Right conjunctiva is not injected.     Left eye: Left conjunctiva is not injected.  Neck:     Trachea: Trachea and phonation normal.  Cardiovascular:     Rate and Rhythm: Normal rate and regular rhythm.  Pulmonary:      Effort: Pulmonary effort is normal.  Abdominal:     General: Abdomen is flat. Bowel sounds are normal. There is no distension.     Palpations: Abdomen is soft.     Tenderness: There is no abdominal tenderness. There is no right CVA tenderness or left CVA tenderness.     Hernia: No hernia is present.  Musculoskeletal:        General: Normal range of motion.     Cervical back: Full passive range of motion without pain, normal range of motion and neck supple. Normal range of motion.  Lymphadenopathy:     Cervical: No cervical adenopathy.  Skin:    General: Skin is warm and dry.     Findings: No erythema or rash.  Neurological:     General: No focal deficit present.     Mental Status: She is alert and oriented to person, place, and time. Mental status is at baseline.  Psychiatric:        Attention and Perception: Attention and perception normal.        Mood and Affect: Mood and affect normal.        Speech: Speech normal.        Behavior: Behavior normal. Behavior is cooperative.        Thought Content: Thought content normal.     Visual Acuity Right Eye Distance:   Left Eye Distance:   Bilateral Distance:    Right Eye Near:   Left Eye Near:    Bilateral Near:     UC Couse / Diagnostics / Procedures:     Radiology No results found.  Procedures Procedures (including critical care time) EKG  Pending results:  Labs Reviewed  POCT URINE DIPSTICK - Abnormal; Notable for the following components:      Result Value   Blood, UA large (*)    Protein Ur, POC =100 (*)    Leukocytes, UA Moderate (2+) (*)    All other components within normal limits  POCT URINE PREGNANCY - Normal  URINE CULTURE  HIV ANTIBODY (ROUTINE TESTING W REFLEX)  SYPHILIS: RPR W/REFLEX TO RPR TITER AND TREPONEMAL ANTIBODIES, TRADITIONAL SCREENING AND DIAGNOSIS ALGORITHM  CERVICOVAGINAL ANCILLARY ONLY    Medications Ordered in UC: Medications - No data to display  UC Diagnoses / Final Clinical  Impressions(s)   I have reviewed the triage vital signs and the nursing notes.  Pertinent labs & imaging results that were available during my care of the patient were reviewed by me and considered in my medical decision making (see chart for details).    Final diagnoses:  Dysuria  Encounter for pregnancy test, result unknown  Screening examination for STD (sexually transmitted disease)  Acute cystitis with hematuria   STD screening was performed, patient advised that the results be posted to their MyChart and if any  of the results are positive, they will be notified by phone, further treatment will be provided as indicated based on results of STD screening. Patient was advised to abstain from sexual intercourse until that they receive the results of their STD testing.  Patient was also advised to use condoms to protect themselves from STD exposure.  Urine dip today revealed presence of hematuria to a large degree, protein, and leukocyte esterase which is concerning for acute urinary tract infection at this time.  Urine culture will be performed due these findings and patient's report of having active symptoms of a urinary tract infection. Patient was advised to begin antibiotics now for empiric treatment of presumed acute urinary tract infection due to findings on urine dip . Patient was advised to begin antibiotics now for empiric treatment of presumed acute urinary tract infection due to having active symptoms of acute urinary tract infection.                    Prescription for Bactrim DS (trimethoprim sulfamethoxazole) has been sent to patient's requested pharmacy.  Patient further advised that they will be contacted with the urine culture results and that treatment will be adjusted as needed based on the results.    Return precautions advised.  Please see discharge instructions below for details of plan of care as provided to patient. ED Prescriptions     Medication Sig Dispense Auth.  Provider   sulfamethoxazole-trimethoprim (BACTRIM DS) 800-160 MG tablet Take 1 tablet by mouth 2 (two) times daily for 3 days. 6 tablet Joesph Shaver Scales, PA-C      PDMP not reviewed this encounter.  Disposition Upon Discharge:  Condition: stable for discharge home  Patient presented with concern for an acute illness with associated systemic symptoms and significant discomfort requiring urgent management. In my opinion, this is a condition that a prudent lay person (someone who possesses an average knowledge of health and medicine) may potentially expect to result in complications if not addressed urgently such as respiratory distress, impairment of bodily function or dysfunction of bodily organs.   As such, the patient has been evaluated and assessed, work-up was performed and treatment was provided in alignment with urgent care protocols and evidence based medicine.  Patient/parent/caregiver has been advised that the patient may require follow up for further testing and/or treatment if the symptoms continue in spite of treatment, as clinically indicated and appropriate.  Routine symptom specific, illness specific and/or disease specific instructions were discussed with the patient and/or caregiver at length.  Prevention strategies for avoiding STD exposure were also discussed.  The patient will follow up with their current PCP if and as advised. If the patient does not currently have a PCP we will assist them in obtaining one.   The patient may need specialty follow up if the symptoms continue, in spite of conservative treatment and management, for further workup, evaluation, consultation and treatment as clinically indicated and appropriate.  Patient/parent/caregiver verbalized understanding and agreement of plan as discussed.  All questions were addressed during visit.  Please see discharge instructions below for further details of plan.    Discharge Instructions      Common  causes of urinary tract infections include but are not limited to holding your urine longer than you should, squatting instead of sitting down when urinating, sitting around in wet clothing such as a wet swimsuit or gym clothes too long, not emptying your bladder after having sexual intercourse, wiping from back to front instead of  front to back after having a bowel movement.     The urinalysis that we performed in the clinic today was abnormal.  Urine culture will be performed per our protocol.  The result of the urine culture will be available in the next 3 to 5 days and will be posted to your MyChart account.  If there is an abnormal finding, you will be contacted by phone and advised of further treatment recommendations, if any.   You were advised to begin antibiotics today because your urinalysis is abnormal and you are having active symptoms of an acute lower urinary tract infection also known as cystitis.     Please pick up and begin taking your prescription for Bactrim DS (trimethoprim sulfamethoxazole) as soon as possible.  Please take all doses exactly as prescribed.  You can take this medication with or without food.  This medication is safe to take with your other medications.   If you receive a phone call advising you that your urine culture is negative but you begin to feel better after taking antibiotics for 24 hours, please feel free to complete the full course of antibiotics as they were likely needed and the urine culture result was false.  If your culture is negative and you do not feel any better, please return for repeat evaluation of other possible causes of your symptoms.   Please consider abstaining from sexual intercourse while you are being treated for urinary tract infection.   The results of your vaginal swab STD testing which screens for gonorrhea, chlamydia, and trichomonas as well as your HIV and syphilis blood test results will be posted to your MyChart account once they  are complete.  This typically takes 1 to 3 days. If any of your results are abnormal, you will receive a phone call regarding treatment.  Prescriptions, if any are needed, will be provided for you at your pharmacy.                       If you have not had complete resolution of your symptoms after completing treatment as prescribed, please return to urgent care for repeat evaluation or follow-up with your primary care provider.  Repeat urinalysis and urine culture may be indicated for more directed therapy.   Your urine pregnancy test was negative.   Thank you for visiting  Urgent Care today.  We appreciate the opportunity to participate in your care.       This office note has been dictated using Teaching laboratory technician.  Unfortunately, this method of dictation can sometimes lead to typographical or grammatical errors.  I apologize for your inconvenience in advance if this occurs.  Please do not hesitate to reach out to me if clarification is needed.       Joesph Shaver Scales, PA-C 07/04/24 1755     [1]  Social History Tobacco Use   Smoking status: Some Days    Types: Cigarettes, E-cigarettes   Smokeless tobacco: Never  Vaping Use   Vaping status: Some Days  Substance Use Topics   Alcohol use: Yes    Comment: occasional   Drug use: Yes    Types: Marijuana    Comment: occasional     Joesph Shaver Living, PA-C 07/04/24 1826  "

## 2024-07-05 ENCOUNTER — Ambulatory Visit: Payer: Self-pay | Admitting: Emergency Medicine

## 2024-07-05 LAB — HIV ANTIBODY (ROUTINE TESTING W REFLEX): HIV Screen 4th Generation wRfx: NONREACTIVE

## 2024-07-05 LAB — CERVICOVAGINAL ANCILLARY ONLY
Chlamydia: NEGATIVE
Comment: NEGATIVE
Comment: NEGATIVE
Comment: NORMAL
Neisseria Gonorrhea: NEGATIVE
Trichomonas: NEGATIVE

## 2024-07-05 LAB — SYPHILIS: RPR W/REFLEX TO RPR TITER AND TREPONEMAL ANTIBODIES, TRADITIONAL SCREENING AND DIAGNOSIS ALGORITHM: RPR Ser Ql: NONREACTIVE

## 2024-07-07 LAB — URINE CULTURE: Culture: >=100000 — AB

## 2024-07-07 NOTE — Progress Notes (Signed)
Finish Bactrim as prescribed.

## 2024-07-28 ENCOUNTER — Telehealth: Admitting: Physician Assistant

## 2024-07-28 DIAGNOSIS — F419 Anxiety disorder, unspecified: Secondary | ICD-10-CM | POA: Diagnosis not present

## 2024-07-28 DIAGNOSIS — F32A Depression, unspecified: Secondary | ICD-10-CM | POA: Diagnosis not present

## 2024-07-28 DIAGNOSIS — B3731 Acute candidiasis of vulva and vagina: Secondary | ICD-10-CM | POA: Diagnosis not present

## 2024-07-28 DIAGNOSIS — Z760 Encounter for issue of repeat prescription: Secondary | ICD-10-CM | POA: Diagnosis not present

## 2024-07-28 MED ORDER — HYDROXYZINE PAMOATE 25 MG PO CAPS: 25.0000 mg | ORAL_CAPSULE | Freq: Three times a day (TID) | ORAL | 0 refills | Status: AC | PRN

## 2024-07-28 MED ORDER — FLUCONAZOLE 150 MG PO TABS: ORAL_TABLET | ORAL | 0 refills | Status: AC

## 2024-07-28 NOTE — Patient Instructions (Signed)
 " Caroline Lynch, thank you for joining Caroline Velma Lunger, PA-C for today's virtual visit.  While this provider is not your primary care provider (PCP), if your PCP is located in our provider database this encounter information will be shared with them immediately following your visit.   A [facility] MyChart account gives you access to today's visit and all your visits, tests, and labs performed at [facility]  click here if you don't have a [facility] MyChart account or go to [email or web address]  Consent: (Patient) Caroline Lynch provided verbal consent for this virtual visit at the beginning of the encounter.  Current Medications:  Current Outpatient Medications:    fluconazole  (DIFLUCAN ) 150 MG tablet, Take 1 tablet PO once. Repeat in 3 days if needed., Disp: 2 tablet, Rfl: 0   Acetaminophen  (TYLENOL  8 HOUR PO), Take by mouth., Disp: , Rfl:    hydrOXYzine  (VISTARIL ) 25 MG capsule, Take 1 capsule (25 mg total) by mouth every 8 (eight) hours as needed. (Patient not taking: Reported on 07/28/2024), Disp: 30 capsule, Rfl: 0   Medications ordered in this encounter:  Meds ordered this encounter  Medications   fluconazole  (DIFLUCAN ) 150 MG tablet    Sig: Take 1 tablet PO once. Repeat in 3 days if needed.    Dispense:  2 tablet    Refill:  0    Supervising Provider:   LAMPTEY, PHILIP O [[id number]]     *If you need refills on other medications prior to your next appointment, please contact your pharmacy*  Follow-Up: Call back or seek an in-person evaluation if the symptoms worsen or if the condition fails to improve as anticipated.  [facility] [phone]  Other Instructions Vaginal Yeast Infection, Adult  Vaginal yeast infection is a condition that causes vaginal discharge as well as soreness, swelling, and redness (inflammation) of the vagina. This is a common condition. Some women get this infection frequently. What are the causes? This condition is  caused by a change in the normal balance of the yeast (Candida) and normal bacteria that live in the vagina. This change causes an overgrowth of yeast, which causes the inflammation. What increases the risk? The condition is more likely to develop in women who: Take antibiotic medicines. Have diabetes. Take birth control pills. Are pregnant. Douche often. Have a weak body defense system (immune system). Have been taking steroid medicines for a long time. Frequently wear tight clothing. What are the signs or symptoms? Symptoms of this condition include: White, thick, creamy vaginal discharge. Swelling, itching, redness, and irritation of the vagina. The lips of the vagina (labia) may be affected as well. Pain or a burning feeling while urinating. Pain during sex. How is this diagnosed? This condition is diagnosed based on: Your medical history. A physical exam. A pelvic exam. Your health care provider will examine a sample of your vaginal discharge under a microscope. Your health care provider may send this sample for testing to confirm the diagnosis. How is this treated? This condition is treated with medicine. Medicines may be over-the-counter or prescription. You may be told to use one or more of the following: Medicine that is taken by mouth (orally). Medicine that is applied as a cream (topically). Medicine that is inserted directly into the vagina (suppository). Follow these instructions at home: Take or apply over-the-counter and prescription medicines only as told by your health care provider. Do not use tampons until your health care provider approves. Do not have sex until your infection  has cleared. Sex can prolong or worsen your symptoms of infection. Ask your health care provider when it is safe to resume sexual activity. Keep all follow-up visits. This is important. How is this prevented?  Do not wear tight clothes, such as pantyhose or tight pants. Wear breathable  cotton underwear. Do not use douches, perfumed soap, creams, or powders. Wipe from front to back after using the toilet. If you have diabetes, keep your blood sugar levels under control. Ask your health care provider for other ways to prevent yeast infections. Contact a health care provider if: You have a fever. Your symptoms go away and then return. Your symptoms do not get better with treatment. Your symptoms get worse. You have new symptoms. You develop blisters in or around your vagina. You have blood coming from your vagina and it is not your menstrual period. You develop pain in your abdomen. Summary Vaginal yeast infection is a condition that causes discharge as well as soreness, swelling, and redness (inflammation) of the vagina. This condition is treated with medicine. Medicines may be over-the-counter or prescription. Take or apply over-the-counter and prescription medicines only as told by your health care provider. Do not douche. Resume sexual activity or use of tampons as instructed by your health care provider. Contact a health care provider if your symptoms do not get better with treatment or your symptoms go away and then return. This information is not intended to replace advice given to you by your health care provider. Make sure you discuss any questions you have with your health care provider. Document Revised: 09/10/2020 Document Reviewed: 09/10/2020 Elsevier Patient Education  2024 [employer]   If you have been instructed to have an in-person evaluation today at a local Urgent Care facility, please use the link below. It will take you to a list of all of our available [facility], including address, phone number and hours of operation. Please do not delay care.  [facility]  If you or a family member do not have a primary care provider, use the link below to schedule a visit and establish care. When you choose a [facility] primary care  physician or advanced practice provider, you gain a long-term partner in health. Find a Primary Care Provider  Learn more about [facility] in-office and virtual care options:  - Get Care Now  "

## 2024-07-28 NOTE — Progress Notes (Signed)
 " Virtual Visit Consent   Caroline Lynch, you are scheduled for a virtual visit with a [facility] provider today. Just as with appointments in the office, your consent must be obtained to participate. Your consent will be active for this visit and any virtual visit you may have with one of our providers in the next 365 days. If you have a MyChart account, a copy of this consent can be sent to you electronically.  As this is a virtual visit, video technology does not allow for your provider to perform a traditional examination. This may limit your provider's ability to fully assess your condition. If your provider identifies any concerns that need to be evaluated in person or the need to arrange testing (such as labs, EKG, etc.), we will make arrangements to do so. Although advances in technology are sophisticated, we cannot ensure that it will always work on either your end or our end. If the connection with a video visit is poor, the visit may have to be switched to a telephone visit. With either a video or telephone visit, we are not always able to ensure that we have a secure connection.  By engaging in this virtual visit, you consent to the provision of healthcare and authorize for your insurance to be billed (if applicable) for the services provided during this visit. Depending on your insurance coverage, you may receive a charge related to this service.  I need to obtain your verbal consent now. Are you willing to proceed with your visit today? [name] has provided verbal consent on 07/28/2024 for a virtual visit (video or telephone). Caroline Lynch, [place]  Date: 07/28/2024 3:32 PM   Virtual Visit via Video Note   I, Caroline Lynch, connected with  Hana Trippett  ([id number], 04/04/1996) on 07/28/24 at  3:15 PM EST by a video-enabled telemedicine application and verified that I am speaking with the correct person using two identifiers.  Location: Patient: Virtual Visit Location  Patient: Home Provider: Virtual Visit Location Provider: Home Office   I discussed the limitations of evaluation and management by telemedicine and the availability of in person appointments. The patient expressed understanding and agreed to proceed.    History of Present Illness: Caroline Lynch is a 29 y.o. who identifies as a female who was assigned female at birth, and is being seen today for possible yeast infection. Endorses symptom onset 4 days ago after using a new scented soaps. Is noting substantial vaginal itching and irritation. Very slight discharge.  Denies back or belly pain. LMP -- 07/12/2024. Denies new partner or concern for STI.  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  Resting comfortably at home.  Head is normocephalic, atraumatic.  No labored breathing. Speech is clear and coherent with logical content.  Patient is alert and oriented at baseline.   Assessment and Plan: 1. Yeast vaginitis (Primary) - fluconazole  (DIFLUCAN ) 150 MG tablet; Take 1 tablet PO once. Repeat in 3 days if needed.  Dispense: 2 tablet; Refill: 0  2. Encounter for medication refill  Prior history. No alarm signs or symptoms. Diflucan  per orders. In-person follow-up for any non resolving, new or worsening symptoms.  Patient asking about one-time refill of her Hydroxyzine  (PRN use) until she can get follow-up with PCP. Refill sent. She is aware no further refills will be  given.  Follow Up Instructions: I discussed the assessment and treatment plan with the patient. The patient was provided an opportunity to ask questions and all were answered. The patient agreed with the plan and demonstrated an understanding of the instructions.  A copy of instructions were sent to the patient via MyChart unless otherwise noted below.   The patient was advised to call back or seek an in-person evaluation if the symptoms worsen or if the condition fails to improve as anticipated.    Caroline Velma Lunger, PA-C    [1] No Known Allergies [2]  Current Outpatient Medications:    fluconazole  (DIFLUCAN ) 150 MG tablet, Take 1 tablet PO once. Repeat in 3 days if needed., Disp: 2 tablet, Rfl: 0   Acetaminophen  (TYLENOL  8 HOUR PO), Take by mouth., Disp: , Rfl:    hydrOXYzine  (VISTARIL ) 25 MG capsule, Take 1 capsule (25 mg total) by mouth every 8 (eight) hours as needed. (Patient not taking: Reported on 07/28/2024), Disp: 30 capsule, Rfl: 0  "
